# Patient Record
Sex: Male | Born: 1975 | Race: White | Hispanic: No | Marital: Married | Smoking: Never smoker
Health system: Southern US, Community
[De-identification: ages and names within clinical notes are randomized; demographics above are authoritative.]

## PROBLEM LIST (undated history)

## (undated) DIAGNOSIS — R600 Localized edema: Secondary | ICD-10-CM

## (undated) DIAGNOSIS — M1990 Unspecified osteoarthritis, unspecified site: Secondary | ICD-10-CM

## (undated) DIAGNOSIS — I10 Essential (primary) hypertension: Secondary | ICD-10-CM

## (undated) DIAGNOSIS — B019 Varicella without complication: Secondary | ICD-10-CM

## (undated) DIAGNOSIS — L409 Psoriasis, unspecified: Secondary | ICD-10-CM

---

## 2004-11-27 ENCOUNTER — Encounter: Admission: RE | Admit: 2004-11-27 | Discharge: 2004-11-27 | Payer: Self-pay | Admitting: Family Medicine

## 2006-11-13 ENCOUNTER — Encounter: Admission: RE | Admit: 2006-11-13 | Discharge: 2006-11-13 | Payer: Self-pay | Admitting: Orthopedic Surgery

## 2006-11-15 ENCOUNTER — Encounter: Admission: RE | Admit: 2006-11-15 | Discharge: 2006-11-15 | Payer: Self-pay | Admitting: Orthopedic Surgery

## 2007-02-07 ENCOUNTER — Emergency Department (HOSPITAL_COMMUNITY): Admission: EM | Admit: 2007-02-07 | Discharge: 2007-02-07 | Payer: Self-pay | Admitting: Emergency Medicine

## 2010-05-01 ENCOUNTER — Emergency Department (HOSPITAL_COMMUNITY): Payer: Self-pay

## 2010-05-01 ENCOUNTER — Emergency Department (HOSPITAL_COMMUNITY)
Admission: EM | Admit: 2010-05-01 | Discharge: 2010-05-01 | Disposition: A | Discharge status: Other Acute Inpatient Hospital | Payer: Self-pay | Attending: Emergency Medicine | Admitting: Emergency Medicine

## 2010-05-01 ENCOUNTER — Inpatient Hospital Stay (HOSPITAL_COMMUNITY)
Admission: EM | Admit: 2010-05-01 | Discharge: 2010-05-05 | DRG: 728 | Disposition: A | Discharge status: Home / Self Care | Payer: Self-pay | Attending: Internal Medicine | Admitting: Internal Medicine

## 2010-05-01 DIAGNOSIS — L02419 Cutaneous abscess of limb, unspecified: Secondary | ICD-10-CM | POA: Insufficient documentation

## 2010-05-01 DIAGNOSIS — A498 Other bacterial infections of unspecified site: Secondary | ICD-10-CM | POA: Diagnosis present

## 2010-05-01 DIAGNOSIS — N411 Chronic prostatitis: Secondary | ICD-10-CM | POA: Diagnosis present

## 2010-05-01 DIAGNOSIS — R509 Fever, unspecified: Secondary | ICD-10-CM | POA: Insufficient documentation

## 2010-05-01 DIAGNOSIS — M479 Spondylosis, unspecified: Secondary | ICD-10-CM | POA: Diagnosis present

## 2010-05-01 DIAGNOSIS — N410 Acute prostatitis: Principal | ICD-10-CM | POA: Diagnosis present

## 2010-05-01 DIAGNOSIS — L03119 Cellulitis of unspecified part of limb: Secondary | ICD-10-CM | POA: Diagnosis present

## 2010-05-01 DIAGNOSIS — T368X5A Adverse effect of other systemic antibiotics, initial encounter: Secondary | ICD-10-CM | POA: Diagnosis not present

## 2010-05-01 DIAGNOSIS — L270 Generalized skin eruption due to drugs and medicaments taken internally: Secondary | ICD-10-CM | POA: Diagnosis not present

## 2010-05-01 LAB — URINE MICROSCOPIC-ADD ON

## 2010-05-01 LAB — URINALYSIS, ROUTINE W REFLEX MICROSCOPIC
Hgb urine dipstick: NEGATIVE
Nitrite: NEGATIVE
Specific Gravity, Urine: >1.03 — ABNORMAL HIGH (ref 1.005–1.030)
Urine Glucose, Fasting: NEGATIVE mg/dL
Urine Glucose, Fasting: NEGATIVE mg/dL
Urobilinogen, UA: 1 mg/dL (ref 0.0–1.0)
pH: 6 (ref 5.0–8.0)

## 2010-05-01 LAB — BASIC METABOLIC PANEL
BUN: 10 mg/dL (ref 6–23)
CO2: 26 mEq/L (ref 19–32)
Calcium: 8.7 mg/dL (ref 8.4–10.5)
GFR calc Af Amer: >60 mL/min (ref >60)
GFR calc non Af Amer: >60 mL/min (ref >60)

## 2010-05-01 LAB — CBC
MCH: 28.1 pg (ref 26.0–34.0)
MCV: 82.3 fL (ref 78.0–100.0)
Platelets: 152 10*3/uL (ref 150–400)
RDW: 12.9 % (ref 11.5–15.5)
WBC: 11.1 10*3/uL — ABNORMAL HIGH (ref 4.0–10.5)

## 2010-05-01 LAB — DIFFERENTIAL
Basophils Absolute: 0 10*3/uL (ref 0.0–0.1)
Eosinophils Absolute: 0 10*3/uL (ref 0.0–0.7)
Eosinophils Relative: 0 % (ref 0–5)
Monocytes Absolute: 0.8 10*3/uL (ref 0.1–1.0)

## 2010-05-02 ENCOUNTER — Inpatient Hospital Stay (HOSPITAL_COMMUNITY): Payer: Self-pay

## 2010-05-02 DIAGNOSIS — L0390 Cellulitis, unspecified: Secondary | ICD-10-CM

## 2010-05-02 DIAGNOSIS — N419 Inflammatory disease of prostate, unspecified: Secondary | ICD-10-CM

## 2010-05-02 DIAGNOSIS — L0291 Cutaneous abscess, unspecified: Secondary | ICD-10-CM

## 2010-05-02 LAB — COMPREHENSIVE METABOLIC PANEL
ALT: 31 U/L (ref 0–53)
AST: 16 U/L (ref 0–37)
Albumin: 3.2 g/dL — ABNORMAL LOW (ref 3.5–5.2)
Alkaline Phosphatase: 53 U/L (ref 39–117)
BUN: 9 mg/dL (ref 6–23)
CO2: 27 mEq/L (ref 19–32)
Calcium: 8.8 mg/dL (ref 8.4–10.5)
Chloride: 106 mEq/L (ref 96–112)
Creatinine, Ser: 0.96 mg/dL (ref 0.4–1.5)
GFR calc Af Amer: >60 mL/min (ref >60)
GFR calc non Af Amer: >60 mL/min (ref >60)
Glucose, Bld: 113 mg/dL — ABNORMAL HIGH (ref 70–99)
Potassium: 4.1 mEq/L (ref 3.5–5.1)
Sodium: 138 mEq/L (ref 135–145)
Total Bilirubin: 1 mg/dL (ref 0.3–1.2)
Total Protein: 6.2 g/dL (ref 6.0–8.3)

## 2010-05-02 LAB — TSH: TSH: 1.152 u[IU]/mL (ref 0.350–4.500)

## 2010-05-02 LAB — CBC
MCH: 27.8 pg (ref 26.0–34.0)
Platelets: 153 10*3/uL (ref 150–400)

## 2010-05-02 LAB — APTT: aPTT: 37 seconds (ref 24–37)

## 2010-05-02 LAB — DIFFERENTIAL
Basophils Absolute: 0 10*3/uL (ref 0.0–0.1)
Lymphs Abs: 1.2 10*3/uL (ref 0.7–4.0)
Monocytes Absolute: 0.8 10*3/uL (ref 0.1–1.0)
Monocytes Relative: 10 % (ref 3–12)
Neutro Abs: 5.8 10*3/uL (ref 1.7–7.7)
Neutrophils Relative %: 74 % (ref 43–77)

## 2010-05-02 LAB — HEMOGLOBIN A1C
Hgb A1c MFr Bld: 5.3 % (ref <5.7)
Mean Plasma Glucose: 105 mg/dL (ref <117)

## 2010-05-02 LAB — PROTIME-INR
INR: 1.05 (ref 0.00–1.49)
Prothrombin Time: 13.9 seconds (ref 11.6–15.2)

## 2010-05-02 NOTE — Consult Note (Signed)
[name], [name]                  ACCOUNT NO.:  [id number]  MEDICAL RECORD NO.:  [id number]           PATIENT TYPE:  I  LOCATION:  1338                         FACILITY:  [facility]  PHYSICIAN:  Armany Mano C. Vernie Ammons, M.D.  DATE OF BIRTH:  1975/03/11  DATE OF CONSULTATION:  05/02/2010 DATE OF DISCHARGE:                                CONSULTATION   HISTORY:  The patient is a 35 year old white male seen in hospital consultation today for further evaluation of recurrent prostatitis and associated right lower extremity cellulitis.  The patient gives a history of having had recurrent prostatitis over the years on multiple occasions.  Each time he experiences initially low back pain which then is associated with pain down into the genital region and testicles.  At no time has he noted any swelling of the testis.  He does have associated dysuria with this.  In addition, he indicates that each time he has an episode of prostatitis, which in the past has successfully been treated with antibiotics, he will have erythema and tenderness with a cellulitis like picture, involving the right lower extremity.  His last episode of prostatitis was approximately 1 year ago.  The first episode was approximately 5 years ago.  He also has a history of recurrence of sinusitis.  He denies any hematuria and is not having any true flank pain.  His symptoms would be considered of moderate severity with no modifying factors.  PAST MEDICAL HISTORY:  Positive for, 1. Right lower extremity cellulitis. 2. History of chronic prostatitis. 3. History of chronic recurrent sinusitis.  SURGICAL HISTORY:  None.  MEDICATIONS ON ADMISSION:  None.  ALLERGIES:  PENICILLIN causes hives.  SOCIAL HISTORY:  He lives at home and reports past ethanol use.  He denies tobacco use.  FAMILY HISTORY:  Positive for coronary artery disease and diabetes in his father.  REVIEW OF SYSTEMS:  Positive for as noted above in the history  of present illness.  He is also noted some upper respiratory symptoms like a cold and occasional dry cough, but no chest pain or shortness of breath.  Otherwise, his 13-point review of systems is negative.  PHYSICAL EXAMINATION:  VITAL SIGNS:  Temperature 99.1, blood pressure is 122/76, pulse 99. GENERAL:  The patient is alert and oriented with appropriate mood and affect.  He appears to be in no apparent distress. HEENT:  Atraumatic, normocephalic.  Oropharynx is clear. NECK:  Supple with midline trachea. CARDIOVASCULAR:  Regular rate and rhythm with no murmur. ABDOMEN:  Obese, soft, and nontender without mass or HSM.  No CVAT. EXTREMITIES:  Erythema below the knee and above the ankle of his right foot anteriorly.  There is no crepitus and it is nontender to palpation. Otherwise extremities are without clubbing, cyanosis, or edema. SKIN:  Warm and dry with multiple tattoos. NEUROLOGIC:  He has no gross focal neurologic deficits.  LABORATORY RESULTS:  His white blood cell count on admission was 11.1 with a left shift, most recently his white blood cell count is normal 7.8 with a hemoglobin of 13.2.  His blood cultures are negative x2.  His  serum creatinine is 0.96 and his coagulation factors are normal.  His urinalysis had 0-2 white blood cells and a few bacteria, but is otherwise negative.  Scrotal ultrasound images were independently reviewed.  I note both testicles are normal consistency without any abnormal internal echo pattern.  Epididymis also appeared normal bilaterally.  This appears to be normal bilateral scrotal ultrasound.  IMPRESSION:  His symptoms suggest an acute flare of chronic prostatitis, but he has no elevation of his white count, clear urine, and a nontender prostate without any sign of prostatic abscess.  I cannot connect the right lower extremity cellulitis with his prostatitis which seems to occur each time in tandem.  Otherwise, his prostatitis appears to  be uncomplicated and should respond to antibiotic therapy.  RECOMMENDATIONS: 1. Broad-spectrum antibiotics intravenously, initially was switched to     oral to be taken for at least 4 weeks with 1 refill and taken for     second 4 weeks if his symptoms would not completely resolve after     the first 4 weeks of antibiotic therapy. 2. Consider ID consult due to recurrent prostatitis with associated     cellulitis of the leg.     Kadarius Cuffe C. Vernie Ammons, M.D.     MCO/MEDQ  D:  05/02/2010  T:  05/02/2010  Job:  [id number]  Electronically Signed by Ihor Gully M.D. on 05/02/2010 06:01:08 PM

## 2010-05-03 ENCOUNTER — Inpatient Hospital Stay (HOSPITAL_COMMUNITY): Payer: Self-pay

## 2010-05-03 DIAGNOSIS — M7989 Other specified soft tissue disorders: Secondary | ICD-10-CM

## 2010-05-03 MED ORDER — IOHEXOL 300 MG/ML  SOLN
130.0000 mL | Freq: Once | INTRAMUSCULAR | Status: AC | PRN
  Administered 2010-05-03: 130 mL via INTRAVENOUS

## 2010-05-03 NOTE — H&P (Signed)
NAMEAGUSTIN, SWATEK                  ACCOUNT NO.:  [id number]  MEDICAL RECORD NO.:  [id number]           PATIENT TYPE:  E  LOCATION:  WLED                         FACILITY:  [facility]  PHYSICIAN:  Kathlen Mody, MD       DATE OF BIRTH:  [birth year]  DATE OF ADMISSION:  05/01/2010 DATE OF DISCHARGE:                             HISTORY & PHYSICAL   PRIMARY CARE PHYSICIAN:  Dr. Silvestre Gunner of family practice  CHIEF COMPLAINT:  Fever since 1 day.  HISTORY OF PRESENT ILLNESS:  This is a 35 year old male with past medical history of cellulitis and multiple episodes of prostatitis, sinusitis, came into ER complaining of fever of 102.7 associated with burning micturition, urinary frequency, urgency, perineal pain, and testicular pain similar to the episode of prostatitis in the past.  The patient is also complaining of right lower extremity erythema, tenderness with cellulitic-like picture since 1 day.  The patient is also complaining of some upper respiratory symptoms like cold, occasional dry cough.  No chest pain, shortness of breath.  No syncope. No dizziness or vertigo.  Denies any nausea, vomiting, diarrhea, or abdominal pain.  Denies any blurring of vision, headache, etc.  The last episode of prostatitis was about a year ago, but he did have multiple episodes of prostatitis every 3 to 6 months over the last 5 years where he got treated with Bactrim for about 4 weeks every time he got an episode of prostatitis.  REVIEW OF SYSTEMS:  See HPI, otherwise negative.  HOME MEDICATIONS:  None.  PAST MEDICAL HISTORY:  Right lower extremity cellulitis, prostatitis, sinusitis.  PAST SURGICAL HISTORY:  None.  FAMILY HISTORY:  Coronary artery disease and diabetes in the father.  ALLERGIES:  The patient is allergic to penicillin, gives him hives.  SOCIAL HISTORY:  He lives at home.  He is an ex ETOH use.  Denies smoking or IV drug abuse.  PHYSICAL EXAMINATION:  VITAL SIGNS:  The patient's  latest vitals are temperature of 99, pulse rate of 99 per minute, respiratory rate of 20 per minute, blood pressure 137/85, saturating about 95% on room air. GENERAL:  He was alert, afebrile, mild distress from perineal pain. HEENT:  Pupils equal and reacting to light.  Moist mucous membranes. NECK:  No JVD. CARDIOVASCULAR:  S1 and S2 heard.  No rubs, murmurs, or gallops. Regular rate rhythm. RESPIRATORY:  Good air entry bilateral.  No adventitious sounds. ABDOMINAL:  Abdomen is obese, soft, nontender, nondistended, and good bowel sounds. EXTREMITIES:  Right lower extremity over the lower leg has erythema extending from the knee and to the ankle.  No draining or weeping and tender to touch with left lower extremity within normal limits. NEUROLOGICAL:  Nonfocal at this time.  LABORATORY DATA:  We have a urinalysis, which showed trace leukocytes with protein in it.  CBC showed WBC count of 11.1, hemoglobin of 14, hematocrit of 41, platelets of 152 with left shift neutrophils.  Basic metabolic panel was within normal limits except for glucose of 108.  The patient had a rapid strep test, which was negative.  RADIOLOGY:  The patient  had a chest x-ray, which showed no acute cardiopulmonary disease.  ASSESSMENT: 1. Acute on chronic prostatitis. 2. Right lower extremity cellulitis. 3. Hyperglycemia.  PLAN:  Acute and chronic prostatitis. We would start the patient on IV Levaquin 500 mg daily.  We will also start him on IV fluids.  Get urine Gram stain and urine culture.  The patient is also complaining of testicular pain.  I would rule out acute epididymis.  We will get an ultrasound of the scrotum.  We will also get a urology consult and pain medication as needed.  Multiple episodes of right lower extremity cellulitis.  The patient denies any injury.  There is no skin break.  Patient has a history of hyperglycemia.  We will first start the patient on IV antibiotics, IV vancomycin,  and dosing as per the pharmacy.  We will get a hemoglobin A1c to see if he has any diabetes, especially concerning with the body habitus and the history of diabetes in the father.  Hyperglycemia.  Get hemoglobin A1c to see if he has diabetes.  Subcu Lovenox for DVT prophylaxis.  The patient is full code.  Time spent with the patient with the history and physical and care of the patient about 40 minutes.          ______________________________ Kathlen Mody, MD     VA/MEDQ  D:  05/01/2010  T:  05/01/2010  Job:  [id number]  Electronically Signed by Kathlen Mody MD on 05/03/2010 12:26:55 AM

## 2010-05-04 ENCOUNTER — Inpatient Hospital Stay (HOSPITAL_COMMUNITY): Payer: Self-pay

## 2010-05-04 DIAGNOSIS — L0291 Cutaneous abscess, unspecified: Secondary | ICD-10-CM

## 2010-05-04 DIAGNOSIS — N419 Inflammatory disease of prostate, unspecified: Secondary | ICD-10-CM

## 2010-05-04 DIAGNOSIS — L0390 Cellulitis, unspecified: Secondary | ICD-10-CM

## 2010-05-04 LAB — URINE CULTURE: Colony Count: 2000

## 2010-05-06 LAB — URINE CULTURE
Colony Count: 70000
Culture  Setup Time: 201202230130

## 2010-05-06 LAB — GC/CHLAMYDIA PROBE AMP, URINE
Chlamydia, Swab/Urine, PCR: NEGATIVE
GC Probe Amp, Urine: NEGATIVE

## 2010-05-06 LAB — MISCELLANEOUS TEST

## 2010-05-06 LAB — ANA: Anti Nuclear Antibody(ANA): NEGATIVE

## 2010-05-07 LAB — CULTURE, BLOOD (ROUTINE X 2): Culture  Setup Time: 201202222322

## 2010-05-08 LAB — CHLAMYDIA ANTIBODIES, IGG: Chlamydia trachomatis IgG: <1:64 {titer}

## 2010-05-08 LAB — HLA-B27 ANTIGEN

## 2010-05-17 NOTE — Discharge Summary (Signed)
[name], [name]                  ACCOUNT NO.:  [id number]  MEDICAL RECORD NO.:  [id number]           PATIENT TYPE:  I  LOCATION:  1338                         FACILITY:  [facility]  PHYSICIAN:  Ladell Pier, M.D.   DATE OF BIRTH:  1976/02/26  DATE OF ADMISSION:  05/01/2010 DATE OF DISCHARGE:  05/04/2010                              DISCHARGE SUMMARY   DISCHARGE DIAGNOSES: 1. Recurrent prostatitis. 2. Recurrent cellulitis. 3. History of chronic recurrent sinusitis.  DISCHARGE MEDICATIONS: 1. Bactrim Double Strength 1 twice daily for 2 months. 2. Oxycodone 5/325 every 4 hours p.r.n.  FOLLOWUP APPOINTMENT:  The patient given number to call the scheduled appointment to follow up with urology, Dr. Vernie Ammons, [phone] and to follow up with his PCP, Dr. Arlyce Dice in 1 week.  PROCEDURES:  CT scan of the pelvis with contrast showed cellulitis of the subcutaneous fat of the right lower leg, otherwise normal.  Scrotal ultrasound, no increased flow to the epididymis to suggest presence of epididymitis.  Chest x-ray, no acute cardiopulmonary process.  CONSULTANT: 1. Infectious disease, Fransisco Hertz, M.D. 2. Urology, Veverly Fells. Vernie Ammons, M.D.  HISTORY OF PRESENT ILLNESS:  The patient is a 35 year old male with past medical history significant for cellulitis, multiple episodes of prostatitis, and sinusitis who came to the ER complaining of fevers up to 102.7 associated with burning on urination, urinary frequency, urgency, and perineal pain and testicular pain similar to episodes of prostatitis in the past.  The patient is also complaining of right lower extremity edema and tenderness.  The patient had no dizziness, no nausea or vomiting.  He had an episode about a year ago of prostatitis but he has had multiple episodes of prostatitis every 3-6 months over the past 5 years.  He was treated with Bactrim for about 4 weeks every time he got an episode of prostatitis.  Please see admission note  for remainder of history, past medical history, family history, social history, meds, allergies, and review of systems per admission H and P.  PHYSICAL EXAMINATION AT THE TIME OF DISCHARGE:  VITAL SIGNS: Temperature 97.9, pulse of 64, respirations 20, blood pressure 133/86, and pulse ox 96% on room air. GENERAL:  The patient is laying in bed, well-nourished, obese white male. HEENT:  Normocephalic, atraumatic.  Pupils reactive to light without erythema. CARDIOVASCULAR:  Regular rate and rhythm. LUNGS:  Clear bilaterally. ABDOMEN:  Positive bowel sounds. EXTREMITIES:  He has some mild erythema of the right lower extremity that is much improved.  HOSPITAL COURSE:  Prostatitis/cellulitis.  The patient was admitted to the hospital, placed on IV Levaquin and vancomycin.  He got the red man syndrome from the vancomycin and that was discontinued and he was continued on the IV Levaquin which was changed to p.o.  On discussion with Dr. Maurice March, one of his cultures came back with 2000 of E-coli and the other one was 70,000 E-coli.  If indeed it is truly 70,000 of E-coli, then indeed he may have prostatitis, and per discussion with Dr. Maurice March, he should be treated for about 2-3 months with Septra.  Noted in the past,  he has been treated with Septra for 1 month and still had recurrent prostatitis, so we will treat him with Septra for 2-3 months. His cellulitis looks much improved, and the Septra does cover the cellulitis as well.  LABORATORY DATA:  At the time of discharge, urine culture showed 70,000 E-coli.  TSH 1.152.  Blood cultures negative x2.  Magnesium level 2.2. Sodium 138, potassium 4.1, chloride 106, CO2 of 27, glucose 113, BUN 9, and creatinine 0.96.  AST 16, ALT 31.  TIME SPENT:  With the patient and wife and doing this discharge is approximately 35 minutes.     Ladell Pier, M.D.     N[place]/MEDQ  D:  05/04/2010  T:  05/04/2010  Job:  [id number]  cc:   Loraine Leriche C. Vernie Ammons,  M.D. Fax: [phone]  Dr. Karen Chafe  Electronically Signed by Ladell Pier M.D. on 05/16/2010 07:56:23 AM

## 2010-05-17 NOTE — Discharge Summary (Signed)
  [name], [name]                  ACCOUNT NO.:  [id number]  MEDICAL RECORD NO.:  [id number]           PATIENT TYPE:  I  LOCATION:  1338                         FACILITY:  [facility]  PHYSICIAN:  Ladell Pier, M.D.   DATE OF BIRTH:  [birth year]  DATE OF ADMISSION:  05/01/2010 DATE OF DISCHARGE:                              DISCHARGE SUMMARY   The patient was to be discharged on May 04, 2010.  Discharge was held after consultation with Dr. Daiva Eves.  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  MRIs of the lumbar sacral spine were ordered but the patient unable to sit in the scanner here, so the patient will have to follow up outpatient to get the procedure done, but he did order other tests, some of the results are still pending but the HIV test is negative.  Rheumatoid factor is less than 10.  C-reactive protein is 2.3.  ESR is 17.  After discussion with Dr. Daiva Eves, we will discharge him on Cipro twice a day for 1 month.  Then, he needs to get a refill from his primary care to do another month.  His wife is concerned about the possibility of Crohn disease as he has been having some issues with his GI system in association with the arthropathy, so discussed with her that I will discharge him, I will give him a dose of steroid now.  If he responds, then we will discharge him on prednisone taper, and he will follow up with Dr. Lovell Sheehan.  We will try to get him in this coming up week and then we will try to get him in with rheumatology.  We told him to call Dr. Maurice March to schedule outpatient appointment and also to call Dr. Vernie Ammons to schedule an outpatient appointment.  We will try to see if I could get him an appointment with the rheumatologist.  Did discuss this with the patient and his wife and we will try to call them with these appointments.  The number was given to me by his wife for him, it is [phone] and for his  wife, Brallan Denio, it is [phone].  Time spent with the patient on doing this discharge is approximately 45 minutes, this time was spent discussing with the patient plan of care after discharge as the patient wanted to stay inhouse to get all this workup done but since this is not something that is very acute, this is really not an inpatient workup and did discuss this with his wife and with the patient.     Ladell Pier, M.D.     N[place]/MEDQ  D:  05/05/2010  T:  05/05/2010  Job:  [id number]  Electronically Signed by Ladell Pier M.D. on 05/16/2010 07:56:26 AM

## 2010-12-17 LAB — CBC
HCT: 44.1
Hemoglobin: 15.2
Platelets: 200
RDW: 13.2
WBC: 15.2 — ABNORMAL HIGH

## 2010-12-17 LAB — URINALYSIS, ROUTINE W REFLEX MICROSCOPIC
Glucose, UA: NEGATIVE
Nitrite: NEGATIVE
Protein, ur: NEGATIVE
pH: 7

## 2010-12-17 LAB — DIFFERENTIAL
Basophils Absolute: 0
Eosinophils Relative: 0
Lymphocytes Relative: 6 — ABNORMAL LOW
Lymphs Abs: 0.9
Neutro Abs: 13.4 — ABNORMAL HIGH

## 2010-12-17 LAB — I-STAT 8, (EC8 V) (CONVERTED LAB)
Acid-base deficit: 1
BUN: 12
Chloride: 105
HCT: 48
Hemoglobin: 16.3
Operator id: 151321
Potassium: 3.9
pCO2, Ven: 37.3 — ABNORMAL LOW

## 2010-12-17 LAB — POCT I-STAT CREATININE
Creatinine, Ser: 1
Operator id: 151321

## 2010-12-27 ENCOUNTER — Emergency Department (HOSPITAL_COMMUNITY)
Admission: EM | Admit: 2010-12-27 | Discharge: 2010-12-28 | Disposition: A | Discharge status: Home / Self Care | Payer: Self-pay | Attending: Emergency Medicine | Admitting: Emergency Medicine

## 2010-12-27 ENCOUNTER — Emergency Department (HOSPITAL_COMMUNITY): Payer: Self-pay

## 2010-12-27 DIAGNOSIS — M25519 Pain in unspecified shoulder: Secondary | ICD-10-CM | POA: Insufficient documentation

## 2010-12-27 DIAGNOSIS — I10 Essential (primary) hypertension: Secondary | ICD-10-CM | POA: Insufficient documentation

## 2010-12-27 DIAGNOSIS — R609 Edema, unspecified: Secondary | ICD-10-CM | POA: Insufficient documentation

## 2010-12-28 LAB — COMPREHENSIVE METABOLIC PANEL
ALT: 43 U/L (ref 0–53)
Alkaline Phosphatase: 75 U/L (ref 39–117)
BUN: 13 mg/dL (ref 6–23)
CO2: 27 mEq/L (ref 19–32)
Calcium: 9.3 mg/dL (ref 8.4–10.5)
GFR calc Af Amer: >90 mL/min (ref >90)
GFR calc non Af Amer: >90 mL/min (ref >90)
Glucose, Bld: 100 mg/dL — ABNORMAL HIGH (ref 70–99)
Potassium: 3.6 mEq/L (ref 3.5–5.1)
Sodium: 139 mEq/L (ref 135–145)

## 2010-12-28 LAB — URINALYSIS, ROUTINE W REFLEX MICROSCOPIC
Bilirubin Urine: NEGATIVE
Ketones, ur: NEGATIVE mg/dL
Leukocytes, UA: NEGATIVE
Nitrite: NEGATIVE
Urobilinogen, UA: 0.2 mg/dL (ref 0.0–1.0)

## 2010-12-28 LAB — DIFFERENTIAL
Basophils Absolute: 0.1 10*3/uL (ref 0.0–0.1)
Eosinophils Relative: 1 % (ref 0–5)
Lymphocytes Relative: 36 % (ref 12–46)
Lymphs Abs: 2.3 10*3/uL (ref 0.7–4.0)
Monocytes Relative: 10 % (ref 3–12)
Neutro Abs: 3.3 10*3/uL (ref 1.7–7.7)

## 2010-12-28 LAB — CBC
HCT: 41.3 % (ref 39.0–52.0)
Hemoglobin: 13.9 g/dL (ref 13.0–17.0)
MCH: 28.1 pg (ref 26.0–34.0)
MCHC: 33.7 g/dL (ref 30.0–36.0)
RBC: 4.95 MIL/uL (ref 4.22–5.81)

## 2011-08-27 ENCOUNTER — Emergency Department (HOSPITAL_COMMUNITY)
Admission: EM | Admit: 2011-08-27 | Discharge: 2011-08-27 | Disposition: A | Discharge status: Home / Self Care | Payer: Self-pay | Attending: Emergency Medicine | Admitting: Emergency Medicine

## 2011-08-27 ENCOUNTER — Encounter (HOSPITAL_COMMUNITY): Payer: Self-pay | Admitting: Emergency Medicine

## 2011-08-27 DIAGNOSIS — R609 Edema, unspecified: Secondary | ICD-10-CM | POA: Insufficient documentation

## 2011-08-27 DIAGNOSIS — Z79899 Other long term (current) drug therapy: Secondary | ICD-10-CM | POA: Insufficient documentation

## 2011-08-27 DIAGNOSIS — X500XXA Overexertion from strenuous movement or load, initial encounter: Secondary | ICD-10-CM | POA: Insufficient documentation

## 2011-08-27 DIAGNOSIS — S335XXA Sprain of ligaments of lumbar spine, initial encounter: Secondary | ICD-10-CM | POA: Insufficient documentation

## 2011-08-27 DIAGNOSIS — M545 Low back pain: Secondary | ICD-10-CM

## 2011-08-27 DIAGNOSIS — Z885 Allergy status to narcotic agent status: Secondary | ICD-10-CM | POA: Insufficient documentation

## 2011-08-27 DIAGNOSIS — Z880 Allergy status to penicillin: Secondary | ICD-10-CM | POA: Insufficient documentation

## 2011-08-27 HISTORY — DX: Localized edema: R60.0

## 2011-08-27 MED ORDER — CYCLOBENZAPRINE HCL 10 MG PO TABS
10.0000 mg | ORAL_TABLET | Freq: Once | ORAL | Status: AC
  Administered 2011-08-27: 10 mg via ORAL
  Filled 2011-08-27: qty 1

## 2011-08-27 MED ORDER — CYCLOBENZAPRINE HCL 10 MG PO TABS: 10.0000 mg | ORAL_TABLET | Freq: Three times a day (TID) | ORAL | Status: DC | PRN

## 2011-08-27 MED ORDER — IBUPROFEN 800 MG PO TABS
800.0000 mg | ORAL_TABLET | Freq: Once | ORAL | Status: AC
  Administered 2011-08-27: 800 mg via ORAL
  Filled 2011-08-27: qty 1

## 2011-08-27 MED ORDER — OXYCODONE-ACETAMINOPHEN 5-325 MG PO TABS
1.0000 | ORAL_TABLET | Freq: Once | ORAL | Status: AC
  Administered 2011-08-27: 1 via ORAL
  Filled 2011-08-27: qty 1

## 2011-08-27 MED ORDER — OXYCODONE-ACETAMINOPHEN 5-325 MG PO TABS: 1.0000 | ORAL_TABLET | ORAL | Status: DC | PRN

## 2011-08-27 NOTE — ED Notes (Signed)
MD at bedside to evaluate.

## 2011-08-27 NOTE — ED Notes (Signed)
Medicated for 7\10 pain. Denies any needs at this time. Call bell within reach.

## 2011-08-27 NOTE — ED Notes (Signed)
Remains resting in bed on back. Pain 7\10 at this time. No distress. Denies needs. Call bell within reach. Awaiting eval.

## 2011-08-27 NOTE — ED Notes (Signed)
Remains resting sitting up in bed. No distress. Denies needs at this time. Pain 7\10. Awaiting to be seen. Call bell within reach.

## 2011-08-27 NOTE — ED Notes (Signed)
Into room to see patient. Resting sitting up in bed. States pain is 7\10. Turned wrong on Sunday and felt something pop in lower left back. Has sharp, nagging pain on entire left side of back. Tender on palpation. Nothing makes pain better or worse. Call bell within reach. Given water per request. Will continue to monitor.

## 2011-08-27 NOTE — ED Provider Notes (Signed)
History     CSN: [id number]  Arrival date & time 08/27/11  4098   First MD Initiated Contact with Patient 08/27/11 [phone]      Chief Complaint  Patient presents with  . Back Pain    (Consider location/radiation/quality/duration/timing/severity/associated sxs/prior treatment) HPI 36 year old male had onset of left lower lumbar pain 3 days ago when he twisted and felt something pop in his back. Since then, he has had severe pain pain is well only in the left lumbar area and does not radiate. He described as dull pain which is currently rated at 7/10 but has been as severe as 9/10. He denies any weakness, numbness, tingling. Denies any bowel or bladder dysfunction. He has taken ibuprofen 400 mg every 4-6 hours with no relief. He has not used ice or heat. He denies previous episodes like this. Past Medical History  Diagnosis Date  . Fluid retention in legs     History reviewed. No pertinent past surgical history.  History reviewed. No pertinent family history.  History  Substance Use Topics  . Smoking status: Never Smoker   . Smokeless tobacco: Not on file  . Alcohol Use: Yes     ocassionally      Review of Systems  Allergies  Penicillins and Dilaudid  Home Medications   Current Outpatient Rx  Name Route Sig Dispense Refill  . HYDROCHLOROTHIAZIDE 25 MG PO TABS Oral Take 25 mg by mouth daily.    . CYCLOBENZAPRINE HCL 10 MG PO TABS Oral Take 1 tablet (10 mg total) by mouth 3 (three) times daily as needed for muscle spasms. 30 tablet 0  . OXYCODONE-ACETAMINOPHEN 5-325 MG PO TABS Oral Take 1 tablet by mouth every 4 (four) hours as needed for pain. 20 tablet 0    BP 170/85  Pulse 78  Temp 98.4 F (36.9 C) (Oral)  Resp 28  Ht 6' (1.829 m)  Wt 350 lb (158.759 kg)  BMI 47.47 kg/m2  SpO2 94%  Physical Exam 36 year old male who is resting comfortably and in no acute distress. He is morbidly obese. Vital signs are normal. Oxygen saturation is 98% which is normal. Head is  normocephalic and atraumatic. PERRLA, EOMI. Neck is nontender and supple. Back has moderate tenderness in the lumbar area with moderate to severe left paralumbar spasm. Straight leg raise is positive on the left at 45 and crossed straight leg raise is positive on the right 45.  Lungs are clear without rales, wheezes, or rhonchi. Heart has regular rate and rhythm without murmur. Abdomen is soft, flat, nontender without masses or hepatosplenomegaly. Extremities: There is 2+ pretibial edema, full range of motion is present, there is no cyanosis. Skin is warm and dry without rash. Neurologic: Mental status is normal, cranial nerves are intact, there are no motor or sensory deficits. ED Course  Procedures (including critical care time)  Labs Reviewed - No data to display No results found.   1. Lumbar pain       MDM  Acute lumbar strain. You'll be treated with NSAIDs, muscle relaxers, and narcotics.        Dione Booze, MD 08/27/11 [phone]

## 2011-08-27 NOTE — ED Notes (Signed)
Patient states he twisted his back 3 days ago and pain is worsening.

## 2011-08-27 NOTE — Discharge Instructions (Signed)
Take an anti-inflammatory dose of anti-inflammatory medication. This is either 12 tablets of ibuprofen a day or 4 tablets of Aleve a day. Make an appointment with your doctor for followup. Try to lose weight. If pain is not being controlled adequately with this regimen, you may need to go through some physical therapy.  Back Pain, Adult Low back pain is very common. About 1 in 5 people have back pain.The cause of low back pain is rarely dangerous. The pain often gets better over time.About half of people with a sudden onset of back pain feel better in just 2 weeks. About 8 in 10 people feel better by 6 weeks.  CAUSES Some common causes of back pain include:  Strain of the muscles or ligaments supporting the spine.   Wear and tear (degeneration) of the spinal discs.   Arthritis.   Direct injury to the back.  DIAGNOSIS Most of the time, the direct cause of low back pain is not known.However, back pain can be treated effectively even when the exact cause of the pain is unknown.Answering your caregiver's questions about your overall health and symptoms is one of the most accurate ways to make sure the cause of your pain is not dangerous. If your caregiver needs more information, he or she may order lab work or imaging tests (X-rays or MRIs).However, even if imaging tests show changes in your back, this usually does not require surgery. HOME CARE INSTRUCTIONS For many people, back pain returns.Since low back pain is rarely dangerous, it is often a condition that people can learn to [facility] their own.   Remain active. It is stressful on the back to sit or stand in one place. Do not sit, drive, or stand in one place for more than 30 minutes at a time. Take short walks on level surfaces as soon as pain allows.Try to increase the length of time you walk each day.   Do not stay in bed.Resting more than 1 or 2 days can delay your recovery.   Do not avoid exercise or work.Your body is made to  move.It is not dangerous to be active, even though your back may hurt.Your back will likely heal faster if you return to being active before your pain is gone.   Pay attention to your body when you bend and lift. Many people have less discomfortwhen lifting if they bend their knees, keep the load close to their bodies,and avoid twisting. Often, the most comfortable positions are those that put less stress on your recovering back.   Find a comfortable position to sleep. Use a firm mattress and lie on your side with your knees slightly bent. If you lie on your back, put a pillow under your knees.   Only take over-the-counter or prescription medicines as directed by your caregiver. Over-the-counter medicines to reduce pain and inflammation are often the most helpful.Your caregiver may prescribe muscle relaxant drugs.These medicines help dull your pain so you can more quickly return to your normal activities and healthy exercise.   Put ice on the injured area.   Put ice in a plastic bag.   Place a towel between your skin and the bag.   Leave the ice on for 15 to 20 minutes, 3 to 4 times a day for the first 2 to 3 days. After that, ice and heat may be alternated to reduce pain and spasms.   Ask your caregiver about trying back exercises and gentle massage. This may be of some benefit.  Avoid feeling anxious or stressed.Stress increases muscle tension and can worsen back pain.It is important to recognize when you are anxious or stressed and learn ways to manage it.Exercise is a great option.  SEEK MEDICAL CARE IF:  You have pain that is not relieved with rest or medicine.   You have pain that does not improve in 1 week.   You have new symptoms.   You are generally not feeling well.  SEEK IMMEDIATE MEDICAL CARE IF:   You have pain that radiates from your back into your legs.   You develop new bowel or bladder control problems.   You have unusual weakness or numbness in your arms  or legs.   You develop nausea or vomiting.   You develop abdominal pain.   You feel faint.  Document Released: 02/24/2005 Document Revised: 02/13/2011 Document Reviewed: 07/15/2010 [facility] Patient Information 2012 [place], [place].  Acetaminophen; Oxycodone tablets What is this medicine? ACETAMINOPHEN; OXYCODONE (a set a MEE noe fen; ox i KOE done) is a pain reliever. It is used to treat mild to moderate pain. This medicine may be used for other purposes; ask your health care provider or pharmacist if you have questions. What should I tell my health care provider before I take this medicine? They need to know if you have any of these conditions: -brain tumor -Crohn's disease, inflammatory bowel disease, or ulcerative colitis -drink more than 3 alcohol containing drinks per day -drug abuse or addiction -head injury -heart or circulation problems -kidney disease or problems going to the bathroom -liver disease -lung disease, asthma, or breathing problems -an unusual or allergic reaction to acetaminophen, oxycodone, other opioid analgesics, other medicines, foods, dyes, or preservatives -pregnant or trying to get pregnant -breast-feeding How should I use this medicine? Take this medicine by mouth with a full glass of water. Follow the directions on the prescription label. Take your medicine at regular intervals. Do not take your medicine more often than directed. Talk to your pediatrician regarding the use of this medicine in children. Special care may be needed. Patients over 6 years old may have a stronger reaction and need a smaller dose. Overdosage: If you think you have taken too much of this medicine contact a poison control center or emergency room at once. NOTE: This medicine is only for you. Do not share this medicine with others. What if I miss a dose? If you miss a dose, take it as soon as you can. If it is almost time for your next dose, take only that dose. Do not take  double or extra doses. What may interact with this medicine? -alcohol or medicines that contain alcohol -antihistamines -barbiturates like amobarbital, butalbital, butabarbital, methohexital, pentobarbital, phenobarbital, thiopental, and secobarbital -benztropine -drugs for bladder problems like solifenacin, trospium, oxybutynin, tolterodine, hyoscyamine, and methscopolamine -drugs for breathing problems like ipratropium and tiotropium -drugs for certain stomach or intestine problems like propantheline, homatropine methylbromide, glycopyrrolate, atropine, belladonna, and dicyclomine -general anesthetics like etomidate, ketamine, nitrous oxide, propofol, desflurane, enflurane, halothane, isoflurane, and sevoflurane -medicines for depression, anxiety, or psychotic disturbances -medicines for pain like codeine, morphine, pentazocine, buprenorphine, butorphanol, nalbuphine, tramadol, and propoxyphene -medicines for sleep -muscle relaxants -naltrexone -phenothiazines like perphenazine, thioridazine, chlorpromazine, mesoridazine, fluphenazine, prochlorperazine, promazine, and trifluoperazine -scopolamine -trihexyphenidyl This list may not describe all possible interactions. Give your health care provider a list of all the medicines, herbs, non-prescription drugs, or dietary supplements you use. Also tell them if you smoke, drink alcohol, or use illegal drugs. Some items may  interact with your medicine. What should I watch for while using this medicine? Tell your doctor or health care professional if your pain does not go away, if it gets worse, or if you have new or a different type of pain. You may develop tolerance to the medicine. Tolerance means that you will need a higher dose of the medication for pain relief. Tolerance is normal and is expected if you take this medicine for a long time. Do not suddenly stop taking your medicine because you may develop a severe reaction. Your body becomes used  to the medicine. This does NOT mean you are addicted. Addiction is a behavior related to getting and using a drug for a nonmedical reason. If you have pain, you have a medical reason to take pain medicine. Your doctor will tell you how much medicine to take. If your doctor wants you to stop the medicine, the dose will be slowly lowered over time to avoid any side effects. You may get drowsy or dizzy. Do not drive, use machinery, or do anything that needs mental alertness until you know how this medicine affects you. Do not stand or sit up quickly, especially if you are an older patient. This reduces the risk of dizzy or fainting spells. Alcohol may interfere with the effect of this medicine. Avoid alcoholic drinks. The medicine will cause constipation. Try to have a bowel movement at least every 2 to 3 days. If you do not have a bowel movement for 3 days, call your doctor or health care professional. Do not take Tylenol (acetaminophen) or medicines that have acetaminophen with this medicine. Too much acetaminophen can be very dangerous. Many nonprescription medicines contain acetaminophen. Always read the labels carefully to avoid taking more acetaminophen. What side effects may I notice from receiving this medicine? Side effects that you should report to your doctor or health care professional as soon as possible: -allergic reactions like skin rash, itching or hives, swelling of the face, lips, or tongue -breathing difficulties, wheezing -confusion -light headedness or fainting spells -severe stomach pain -yellowing of the skin or the whites of the eyes Side effects that usually do not require medical attention (report to your doctor or health care professional if they continue or are bothersome): -dizziness -drowsiness -nausea -vomiting This list may not describe all possible side effects. Call your doctor for medical advice about side effects. You may report side effects to FDA at  1-800-FDA-1088. Where should I keep my medicine? Keep out of the reach of children. This medicine can be abused. Keep your medicine in a safe place to protect it from theft. Do not share this medicine with anyone. Selling or giving away this medicine is dangerous and against the law. Store at room temperature between 20 and 25 degrees C (68 and 77 degrees F). Keep container tightly closed. Protect from light. Flush any unused medicines down the toilet. Do not use the medicine after the expiration date. NOTE: This sheet is a summary. It may not cover all possible information. If you have questions about this medicine, talk to your doctor, pharmacist, or health care provider.  2012, [employer]. (01/24/2008 10:01:21 AM)  Cyclobenzaprine tablets What is this medicine? CYCLOBENZAPRINE (sye kloe BEN za preen) is a muscle relaxer. It is used to treat muscle pain, spasms, and stiffness. This medicine may be used for other purposes; ask your health care provider or pharmacist if you have questions. What should I tell my health care provider before I take this  medicine? They need to know if you have any of these conditions: -heart disease, irregular heartbeat, or previous heart attack -liver disease -thyroid problem -an unusual or allergic reaction to cyclobenzaprine, tricyclic antidepressants, lactose, other medicines, foods, dyes, or preservatives -pregnant or trying to get pregnant -breast-feeding How should I use this medicine? Take this medicine by mouth with a glass of water. Follow the directions on the prescription label. If this medicine upsets your stomach, take it with food or milk. Take your medicine at regular intervals. Do not take it more often than directed. Talk to your pediatrician regarding the use of this medicine in children. Special care may be needed. Overdosage: If you think you have taken too much of this medicine contact a poison control center or emergency room at  once. NOTE: This medicine is only for you. Do not share this medicine with others. What if I miss a dose? If you miss a dose, take it as soon as you can. If it is almost time for your next dose, take only that dose. Do not take double or extra doses. What may interact with this medicine? Do not take this medicine with any of the following medications: -cisapride -droperidol -flecainide -grepafloxacin -halofantrine -levomethadyl -MAOIs like Carbex, Eldepryl, Marplan, Nardil, and Parnate -nilotinib -pimozide -probucol -sertindole This medicine may also interact with the following medications: -abarelix -alcohol -contrast dyes -dolasetron -guanethidine -medicines for cancer -medicines for depression, anxiety, or psychotic disturbances -medicines to treat an irregular heartbeat -medicines used for sleep or numbness during surgery or procedure -methadone -octreotide -ondansetron -palonosetron -phenothiazines like chlorpromazine, mesoridazine, prochlorperazine, thioridazine -some medicines for infection like alfuzosin, chloroquine, clarithromycin, levofloxacin, mefloquine, pentamidine, troleandomycin -tramadol -vardenafil This list may not describe all possible interactions. Give your health care provider a list of all the medicines, herbs, non-prescription drugs, or dietary supplements you use. Also tell them if you smoke, drink alcohol, or use illegal drugs. Some items may interact with your medicine. What should I watch for while using this medicine? Check with your doctor or health care professional if your condition does not improve within 1 to 3 weeks. You may get drowsy or dizzy when you first start taking the medicine or change doses. Do not drive, use machinery, or do anything that may be dangerous until you know how the medicine affects you. Stand or sit up slowly. Your mouth may get dry. Drinking water, chewing sugarless gum, or sucking on hard candy may help. What side  effects may I notice from receiving this medicine? Side effects that you should report to your doctor or health care professional as soon as possible: -allergic reactions like skin rash, itching or hives, swelling of the face, lips, or tongue -chest pain -fast heartbeat -hallucinations -seizures -vomiting Side effects that usually do not require medical attention (report to your doctor or health care professional if they continue or are bothersome): -headache This list may not describe all possible side effects. Call your doctor for medical advice about side effects. You may report side effects to FDA at 1-800-FDA-1088. Where should I keep my medicine? Keep out of the reach of children. Store at room temperature between 15 and 30 degrees C (59 and 86 degrees F). Keep container tightly closed. Throw away any unused medicine after the expiration date. NOTE: This sheet is a summary. It may not cover all possible information. If you have questions about this medicine, talk to your doctor, pharmacist, or health care provider.  2012, Elsevier/[employer] Standard. (06/07/2007 10:26:21 PM)

## 2011-09-02 ENCOUNTER — Emergency Department (HOSPITAL_COMMUNITY)
Admission: EM | Admit: 2011-09-02 | Discharge: 2011-09-02 | Disposition: A | Discharge status: Home / Self Care | Payer: Self-pay | Attending: Emergency Medicine | Admitting: Emergency Medicine

## 2011-09-02 ENCOUNTER — Encounter (HOSPITAL_COMMUNITY): Payer: Self-pay

## 2011-09-02 ENCOUNTER — Emergency Department (HOSPITAL_COMMUNITY): Payer: Self-pay

## 2011-09-02 DIAGNOSIS — E669 Obesity, unspecified: Secondary | ICD-10-CM | POA: Insufficient documentation

## 2011-09-02 DIAGNOSIS — M129 Arthropathy, unspecified: Secondary | ICD-10-CM | POA: Insufficient documentation

## 2011-09-02 DIAGNOSIS — M5430 Sciatica, unspecified side: Secondary | ICD-10-CM | POA: Insufficient documentation

## 2011-09-02 HISTORY — DX: Unspecified osteoarthritis, unspecified site: M19.90

## 2011-09-02 MED ORDER — OXYCODONE-ACETAMINOPHEN 5-325 MG PO TABS
1.0000 | ORAL_TABLET | Freq: Once | ORAL | Status: AC
  Administered 2011-09-02: 1 via ORAL
  Filled 2011-09-02: qty 1

## 2011-09-02 MED ORDER — TRAMADOL HCL 50 MG PO TABS: 50.0000 mg | ORAL_TABLET | Freq: Four times a day (QID) | ORAL | Status: AC | PRN

## 2011-09-02 MED ORDER — PREDNISONE 10 MG PO TABS: ORAL_TABLET | ORAL | Status: DC

## 2011-09-02 NOTE — ED Notes (Signed)
Pt felt a pop in back around 2 weeks ago.  States it took his breath away.  Was seen at [facility], was given pain meds and it has not helped.  Pt unable to sleep at night d/t the pain. Pt c/o the low back hurting as well as pain running down his legs.

## 2011-09-02 NOTE — ED Provider Notes (Signed)
History     CSN: [id number]  Arrival date & time 09/02/11  1436   First MD Initiated Contact with Patient 09/02/11 1738      Chief Complaint  Patient presents with  . Back Pain    (Consider location/radiation/quality/duration/timing/severity/associated sxs/prior treatment) HPI History from patient. 36 year old male who presents with lower back pain. He states this started about a week ago when he twisted and felt something "pop" in his back. He has had severe pain to the lumbar area on both sides, worse on the left side, since. Pain does radiate into his bilateral legs, left greater than right. Pain is dull and achy in nature and fluctuates in intensity, made worse by movements. Denies any numbness, weakness, tingling. No bowel/bladder dysfunction. No urinary retention. He has not tried ice or heat. No history of back problems. Reviewof  the patient's previous chart indicates that he was seen for the same at [facility] on June 19 and given prescriptions for Percocet and Flexeril.  Past Medical History  Diagnosis Date  . Fluid retention in legs   . Arthritis     History reviewed. No pertinent past surgical history.  History reviewed. No pertinent family history.  History  Substance Use Topics  . Smoking status: Never Smoker   . Smokeless tobacco: Not on file  . Alcohol Use: Yes     ocassionally      Review of Systems as per HPI  Allergies  Penicillins and Dilaudid  Home Medications   Current Outpatient Rx  Name Route Sig Dispense Refill  . CYCLOBENZAPRINE HCL 10 MG PO TABS Oral Take 10 mg by mouth 3 (three) times daily as needed.    Marland Kitchen HYDROCHLOROTHIAZIDE 25 MG PO TABS Oral Take 25 mg by mouth daily.    . OXYCODONE-ACETAMINOPHEN 5-325 MG PO TABS Oral Take 1 tablet by mouth every 4 (four) hours as needed.      BP 129/82  Pulse 88  Temp 98.3 F (36.8 C) (Oral)  Resp 16  SpO2 98%  Physical Exam  Nursing note and vitals reviewed. Constitutional: He appears  well-developed and well-nourished. No distress.       obese  HENT:  Head: Normocephalic and atraumatic.  Neck: Normal range of motion.  Cardiovascular: Normal rate, regular rhythm and normal heart sounds.   Pulmonary/Chest: Effort normal and breath sounds normal. He exhibits no tenderness.  Abdominal: Soft. Bowel sounds are normal. There is no tenderness. There is no rebound and no guarding.  Musculoskeletal: Normal range of motion.       Spine: No palpable stepoff, crepitus, or gross deformity appreciated. No midline tenderness. Palp spasm to L paravertebral muscles with pain. Pos straight leg raise.   Neurological: He is alert.       Sensation grossly intact to lt touch and equal bilat to LEs, DP/PT pulses intact, 2+ achilles reflexes, ambulatory with nl gait  Skin: Skin is warm and dry. He is not diaphoretic.  Psychiatric: He has a normal mood and affect.    ED Course  Procedures (including critical care time)  Labs Reviewed - No data to display Dg Lumbar Spine Complete  09/02/2011  *RADIOLOGY REPORT*  Clinical Data: Turned the wrong way and felt a pop in his back 2 weeks ago, left side low back pain into middle of back, pain down both legs  LUMBAR SPINE - COMPLETE 4+ VIEW  Comparison: 12/27/2010  Findings: Five non-rib bearing lumbar vertebrae. Osseous mineralization normal. Minimally anterior endplate spur formation superior T12.  Lumbar vertebral body and disc space heights maintained. No acute fracture, subluxation or bone destruction. No spondylolysis. SI joints preserved.  IMPRESSION: No acute lumbar spine abnormalities. Minimal degenerative disc disease changes T11-T12.  Original Report Authenticated By: Lollie Marrow, M.D.     1. Sciatica       MDM  Patient with positive straight leg raise. Possible sciatica. Prescription for prednisone. Instructed followup with PCP if not improving. Return precautions discussed.       Grant Fontana, PA-C 09/02/11 1835

## 2011-09-02 NOTE — Discharge Instructions (Signed)
You may have irritation of the sciatic nerve. Please take the steroid as prescribed for the next several days. Follow up with your family doctor with increased pain, numbness/weakness in the legs, fever/chills, or any other worrisome symptoms.  Sciatica Sciatica is a weakness and/or changes in sensation (tingling, jolts, hot and cold, numbness) along the path the sciatic nerve travels. Irritation or damage to lumbar nerve roots is often also referred to as lumbar radiculopathy.  Lumbar radiculopathy (Sciatica) is the most common form of this problem. Radiculopathy can occur in any of the nerves coming out of the spinal cord. The problems caused depend on which nerves are involved. The sciatic nerve is the large nerve supplying the branches of nerves going from the hip to the toes. It often causes a numbness or weakness in the skin and/or muscles that the sciatic nerve serves. It also may cause symptoms (problems) of pain, burning, tingling, or electric shock-like feelings in the path of this nerve. This usually comes from injury to the fibers that make up the sciatic nerve. Some of these symptoms are low back pain and/or unpleasant feelings in the following areas:  From the mid-buttock down the back of the leg to the back of the knee.   And/or the outside of the calf and top of the foot.   And/or behind the inner ankle to the sole of the foot.  CAUSES   Herniated or slipped disc. Discs are the little cushions between the bones in the back.   Pressure by the piriformis muscle in the buttock on the sciatic nerve (Piriformis Syndrome).   Misalignment of the bones in the lower back and buttocks (Sacroiliac Joint Derangement).   Narrowing of the spinal canal that puts pressure on or pinches the fibers that make up the sciatic nerve.   A slipped vertebra that is out of line with those above or beneath it.   Abnormality of the nervous system itself so that nerve fibers do not transmit signals  properly, especially to feet and calves (neuropathy).   Tumor (this is rare).  Your caregiver can usually determine the cause of your sciatica and begin the treatment most likely to help you. TREATMENT  Taking over-the-counter painkillers, physical therapy, rest, exercise, spinal manipulation, and injections of anesthetics and/or steroids may be used. Surgery, acupuncture, and Yoga can also be effective. Mind over matter techniques, mental imagery, and changing factors such as your bed, chair, desk height, posture, and activities are other treatments that may be helpful. You and your caregiver can help determine what is best for you. With proper diagnosis, the cause of most sciatica can be identified and removed. Communication and cooperation between your caregiver and you is essential. If you are not successful immediately, do not be discouraged. With time, a proper treatment can be found that will make you comfortable. HOME CARE INSTRUCTIONS   If the pain is coming from a problem in the back, applying ice to that area for 15 to 20 minutes, 3 to 4 times per day while awake, may be helpful. Put the ice in a plastic bag. Place a towel between the bag of ice and your skin.   You may exercise or perform your usual activities if these do not aggravate your pain, or as suggested by your caregiver.   Only take over-the-counter or prescription medicines for pain, discomfort, or fever as directed by your caregiver.   If your caregiver has given you a follow-up appointment, it is very important to keep  that appointment. Not keeping the appointment could result in a chronic or permanent injury, pain, and disability. If there is any problem keeping the appointment, you must call back to this facility for assistance.  SEEK IMMEDIATE MEDICAL CARE IF:   You experience loss of control of bowel or bladder.   You have increasing weakness in the trunk, buttocks, or legs.   There is numbness in any areas from the  hip down to the toes.   You have difficulty walking or keeping your balance.   You have any of the above, with fever or forceful vomiting.  Document Released: 02/18/2001 Document Revised: 02/13/2011 Document Reviewed: 10/08/2007 [facility] Patient Information 2012 [place], [place].

## 2011-09-04 NOTE — ED Provider Notes (Signed)
Medical screening examination/treatment/procedure(s) were performed by non-physician practitioner and as supervising physician I was immediately available for consultation/collaboration.  Tamara Monteith T Rodrigues [place], MD 09/04/11 1746 

## 2011-11-18 ENCOUNTER — Emergency Department (HOSPITAL_COMMUNITY): Payer: Self-pay

## 2011-11-18 ENCOUNTER — Emergency Department (HOSPITAL_COMMUNITY)
Admission: EM | Admit: 2011-11-18 | Discharge: 2011-11-18 | Disposition: A | Discharge status: Home / Self Care | Payer: Self-pay | Attending: Emergency Medicine | Admitting: Emergency Medicine

## 2011-11-18 ENCOUNTER — Encounter (HOSPITAL_COMMUNITY): Payer: Self-pay | Admitting: *Deleted

## 2011-11-18 DIAGNOSIS — X500XXA Overexertion from strenuous movement or load, initial encounter: Secondary | ICD-10-CM | POA: Insufficient documentation

## 2011-11-18 DIAGNOSIS — S93409A Sprain of unspecified ligament of unspecified ankle, initial encounter: Secondary | ICD-10-CM | POA: Insufficient documentation

## 2011-11-18 DIAGNOSIS — Y9389 Activity, other specified: Secondary | ICD-10-CM | POA: Insufficient documentation

## 2011-11-18 DIAGNOSIS — Y998 Other external cause status: Secondary | ICD-10-CM | POA: Insufficient documentation

## 2011-11-18 MED ORDER — NAPROXEN 500 MG PO TABS: 500.0000 mg | ORAL_TABLET | Freq: Two times a day (BID) | ORAL | Status: DC | PRN

## 2011-11-18 MED ORDER — IBUPROFEN 400 MG PO TABS
400.0000 mg | ORAL_TABLET | Freq: Once | ORAL | Status: AC
  Administered 2011-11-18: 400 mg via ORAL
  Filled 2011-11-18: qty 1

## 2011-11-18 MED ORDER — HYDROCODONE-ACETAMINOPHEN 5-500 MG PO TABS: 1.0000 | ORAL_TABLET | Freq: Four times a day (QID) | ORAL | Status: AC | PRN

## 2011-11-18 MED ORDER — HYDROCODONE-ACETAMINOPHEN 5-325 MG PO TABS
2.0000 | ORAL_TABLET | Freq: Once | ORAL | Status: AC
  Administered 2011-11-18: 2 via ORAL
  Filled 2011-11-18: qty 2

## 2011-11-18 NOTE — ED Notes (Signed)
Pt requires bariatric crutches and no bariatric crutches are available at this time, EDP notified and new orders received

## 2011-11-18 NOTE — ED Notes (Signed)
Pt states that he twisted his left ankle this am when he missed a step. Cms intact distal

## 2011-11-25 NOTE — ED Provider Notes (Signed)
History    36 year old male with left ankle pain. Acute onset shortly before arrival. Patient twisted his ankle when he missed a stat. Persistent pain since. Can bear weight but with increased pain. No numbness or tingling. Denies any injury anywhere else.  CSN: [id number]  Arrival date & time 11/18/11  7829   First MD Initiated Contact with Patient 11/18/11 1951      Chief Complaint  Patient presents with  . Ankle Pain    (Consider location/radiation/quality/duration/timing/severity/associated sxs/prior treatment) HPI  Past Medical History  Diagnosis Date  . Fluid retention in legs   . Arthritis     History reviewed. No pertinent past surgical history.  No family history on file.  History  Substance Use Topics  . Smoking status: Never Smoker   . Smokeless tobacco: Not on file  . Alcohol Use: Yes     ocassionally      Review of Systems   Review of symptoms negative unless otherwise noted in HPI.   Allergies  Penicillins and Dilaudid  Home Medications   Current Outpatient Rx  Name Route Sig Dispense Refill  . HYDROCHLOROTHIAZIDE 25 MG PO TABS Oral Take 25 mg by mouth daily.    . IBUPROFEN 200 MG PO TABS Oral Take 800 mg by mouth daily as needed. For pain    . PREDNISONE 10 MG PO TABS  Take 6 tabs (60 mg) PO day one, 5 tabs day two, 4 tabs day three, 3 tabs day four, 2 tabs day five, 1 tab day six 21 tablet 0  . HYDROCODONE-ACETAMINOPHEN 5-500 MG PO TABS Oral Take 1-2 tablets by mouth every 6 (six) hours as needed for pain. 15 tablet 0  . NAPROXEN 500 MG PO TABS Oral Take 1 tablet (500 mg total) by mouth 2 (two) times daily as needed. 20 tablet 0    BP 148/90  Pulse 96  Temp 98.7 F (37.1 C) (Oral)  Resp 20  Ht 6' (1.829 m)  Wt 350 lb (158.759 kg)  BMI 47.47 kg/m2  SpO2 98%  Physical Exam  Nursing note and vitals reviewed. Constitutional: He appears well-developed and well-nourished. No distress.  HENT:  Head: Normocephalic and atraumatic.  Eyes:  Conjunctivae normal are normal. Right eye exhibits no discharge. Left eye exhibits no discharge.  Neck: Neck supple.  Cardiovascular: Normal rate, regular rhythm and normal heart sounds.  Exam reveals no gallop and no friction rub.   No murmur heard. Pulmonary/Chest: Effort normal and breath sounds normal. No respiratory distress.  Abdominal: Soft. He exhibits no distension. There is no tenderness.  Musculoskeletal: He exhibits no edema and no tenderness.       L ankle diffusely swollen. No ecchymosis. Tenderness over medial and lateral malleoli. NVI distally.  Neurological: He is alert.  Skin: Skin is warm and dry.  Psychiatric: He has a normal mood and affect. His behavior is normal. Thought content normal.    ED Course  Procedures (including critical care time)  Labs Reviewed - No data to display No results found.  Dg Ankle Complete Left  11/18/2011  *RADIOLOGY REPORT*  Clinical Data: Left ankle pain, swelling and lateral bruising following a fall.  LEFT ANKLE COMPLETE - 3+ VIEW  Comparison: None.  Findings: Diffuse soft tissue swelling, most pronounced laterally. No fracture, dislocation or effusion seen.  IMPRESSION: No fracture.   Original Report Authenticated By: Darrol Angel, M.D.     1. Ankle sprain       MDM  36yM with ankle  pain. xr neg for fx. Plan ankle support and prn pain meds. Return precautions discussed.        Raeford Razor, MD 11/25/11 [phone]

## 2012-02-22 ENCOUNTER — Encounter (HOSPITAL_COMMUNITY): Payer: Self-pay | Admitting: Emergency Medicine

## 2012-02-22 ENCOUNTER — Emergency Department (HOSPITAL_COMMUNITY)
Admission: EM | Admit: 2012-02-22 | Discharge: 2012-02-22 | Disposition: A | Discharge status: Home / Self Care | Payer: 59 | Attending: Emergency Medicine | Admitting: Emergency Medicine

## 2012-02-22 DIAGNOSIS — Z8739 Personal history of other diseases of the musculoskeletal system and connective tissue: Secondary | ICD-10-CM | POA: Insufficient documentation

## 2012-02-22 DIAGNOSIS — M7989 Other specified soft tissue disorders: Secondary | ICD-10-CM | POA: Insufficient documentation

## 2012-02-22 DIAGNOSIS — L0390 Cellulitis, unspecified: Secondary | ICD-10-CM

## 2012-02-22 DIAGNOSIS — L03119 Cellulitis of unspecified part of limb: Secondary | ICD-10-CM | POA: Insufficient documentation

## 2012-02-22 DIAGNOSIS — Z79899 Other long term (current) drug therapy: Secondary | ICD-10-CM | POA: Insufficient documentation

## 2012-02-22 DIAGNOSIS — L02419 Cutaneous abscess of limb, unspecified: Secondary | ICD-10-CM | POA: Insufficient documentation

## 2012-02-22 LAB — CBC WITH DIFFERENTIAL/PLATELET
Basophils Absolute: 0 10*3/uL (ref 0.0–0.1)
Basophils Relative: 0 % (ref 0–1)
Eosinophils Absolute: 0.1 10*3/uL (ref 0.0–0.7)
MCH: 28.3 pg (ref 26.0–34.0)
MCHC: 34.2 g/dL (ref 30.0–36.0)
Neutrophils Relative %: 67 % (ref 43–77)
Platelets: 222 10*3/uL (ref 150–400)
RDW: 12.9 % (ref 11.5–15.5)

## 2012-02-22 LAB — COMPREHENSIVE METABOLIC PANEL
AST: 23 U/L (ref 0–37)
Albumin: 3.5 g/dL (ref 3.5–5.2)
Alkaline Phosphatase: 78 U/L (ref 39–117)
BUN: 11 mg/dL (ref 6–23)
Potassium: 3.7 mEq/L (ref 3.5–5.1)
Sodium: 137 mEq/L (ref 135–145)
Total Protein: 7 g/dL (ref 6.0–8.3)

## 2012-02-22 MED ORDER — CLINDAMYCIN HCL 150 MG PO CAPS: 300.0000 mg | ORAL_CAPSULE | Freq: Four times a day (QID) | ORAL | Status: DC

## 2012-02-22 MED ORDER — CLINDAMYCIN PHOSPHATE 600 MG/4ML IJ SOLN
600.0000 mg | Freq: Once | INTRAMUSCULAR | Status: AC
  Administered 2012-02-22: 600 mg via INTRAMUSCULAR
  Filled 2012-02-22 (×2): qty 4

## 2012-02-22 MED ORDER — OXYCODONE-ACETAMINOPHEN 5-325 MG PO TABS: 2.0000 | ORAL_TABLET | ORAL | Status: DC | PRN

## 2012-02-22 NOTE — ED Notes (Signed)
Unable to locate medication.  Per pharmacy it was tubed to Pod B.  They will resend medication.  Pt moved to hallway due to trauma pt needing room.  Explained delay to pt and he verbalizes understanding.

## 2012-02-22 NOTE — ED Notes (Signed)
Patient with right leg pain.  Leg is red, warm to touch, swollen.  Patient states that now is having a hard time walking and hurts to touch.  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

## 2012-02-22 NOTE — ED Notes (Signed)
Pt states redness swelling on right leg since Friday, and that it seems like previous flare ups

## 2012-02-22 NOTE — ED Provider Notes (Signed)
History     CSN: [id number]  Arrival date & time 02/22/12  1933   First MD Initiated Contact with Patient 02/22/12 2117      Chief Complaint  Patient presents with  . Leg Pain    (Consider location/radiation/quality/duration/timing/severity/associated sxs/prior treatment) Patient is a 36 y.o. male presenting with leg pain. The history is provided by the patient.  Leg Pain    patient here with swelling and redness to his right lower extremity x2 days. No fever or chills. History of cellulitis in the past and this is similar. no recent trauma. No medications taken prior to arrival. Pain is worse with inhalation or movement and better with nothing  Past Medical History  Diagnosis Date  . Fluid retention in legs   . Arthritis     History reviewed. No pertinent past surgical history.  No family history on file.  History  Substance Use Topics  . Smoking status: Never Smoker   . Smokeless tobacco: Not on file  . Alcohol Use: Yes     Comment: ocassionally      Review of Systems  All other systems reviewed and are negative.    Allergies  Penicillins and Dilaudid  Home Medications   Current Outpatient Rx  Name  Route  Sig  Dispense  Refill  . HYDROCHLOROTHIAZIDE 25 MG PO TABS   Oral   Take 25 mg by mouth daily.         . IBUPROFEN 200 MG PO TABS   Oral   Take 800 mg by mouth daily as needed. For pain         . PREDNISONE 10 MG PO TABS   Oral   Take 10 mg by mouth daily.           BP 167/114  Pulse 98  Temp 98.7 F (37.1 C) (Oral)  Resp 20  SpO2 97%  Physical Exam  Nursing note and vitals reviewed. Constitutional: He is oriented to person, place, and time. He appears well-developed and well-nourished.  Non-toxic appearance. No distress.  HENT:  Head: Normocephalic and atraumatic.  Eyes: Conjunctivae normal, EOM and lids are normal. Pupils are equal, round, and reactive to light.  Neck: Normal range of motion. Neck supple. No tracheal  deviation present. No mass present.  Cardiovascular: Normal rate, regular rhythm and normal heart sounds.  Exam reveals no gallop.   No murmur heard. Pulmonary/Chest: Effort normal and breath sounds normal. No stridor. No respiratory distress. He has no decreased breath sounds. He has no wheezes. He has no rhonchi. He has no rales.  Abdominal: Soft. Normal appearance and bowel sounds are normal. He exhibits no distension. There is no tenderness. There is no rebound and no CVA tenderness.  Musculoskeletal: Normal range of motion. He exhibits no edema and no tenderness.       Right lower extremity with erythema and edema. No crepitus. Neurovascular intact  Neurological: He is alert and oriented to person, place, and time. He has normal strength. No cranial nerve deficit or sensory deficit. GCS eye subscore is 4. GCS verbal subscore is 5. GCS motor subscore is 6.  Skin: Skin is warm and dry. No abrasion and no rash noted.  Psychiatric: He has a normal mood and affect. His speech is normal and behavior is normal.    ED Course  Procedures (including critical care time)  Labs Reviewed  COMPREHENSIVE METABOLIC PANEL - Abnormal; Notable for the following:    Glucose, Bld 132 (*)  All other components within normal limits  CBC WITH DIFFERENTIAL   No results found.   No diagnosis found.    MDM  Patient given IM dose of clindamycin here. Will be placed on clindamycin due to his penicillin allergy and treated for his cellulitis       Toy Baker, MD 02/22/12 2135

## 2012-02-22 NOTE — ED Notes (Signed)
Pt brought to Pod C for antibiotic administration.  Informed pt that he would need to wait 20 minutes after receiving the shot to make sure he doesn't have a reaction.  Pt verbalizes understanding.

## 2012-02-22 NOTE — ED Notes (Signed)
Pt offered drink.  Pt given a diet coke.  Ambulatory to bathroom without any difficulty.

## 2012-02-22 NOTE — ED Notes (Signed)
Pt given discharge instructions.  Waiting for medications from pharmacy.

## 2012-09-18 IMAGING — CR DG LUMBAR SPINE COMPLETE 4+V
5 series · 5 of 5 positions shown · non-contrast
Comparison: CT of the pelvis performed 05/02/2010

CLINICAL DATA: Lower back and pelvic pain for 3 weeks.

LUMBAR SPINE - COMPLETE 4+ VIEW

[t lumbar spine ap]
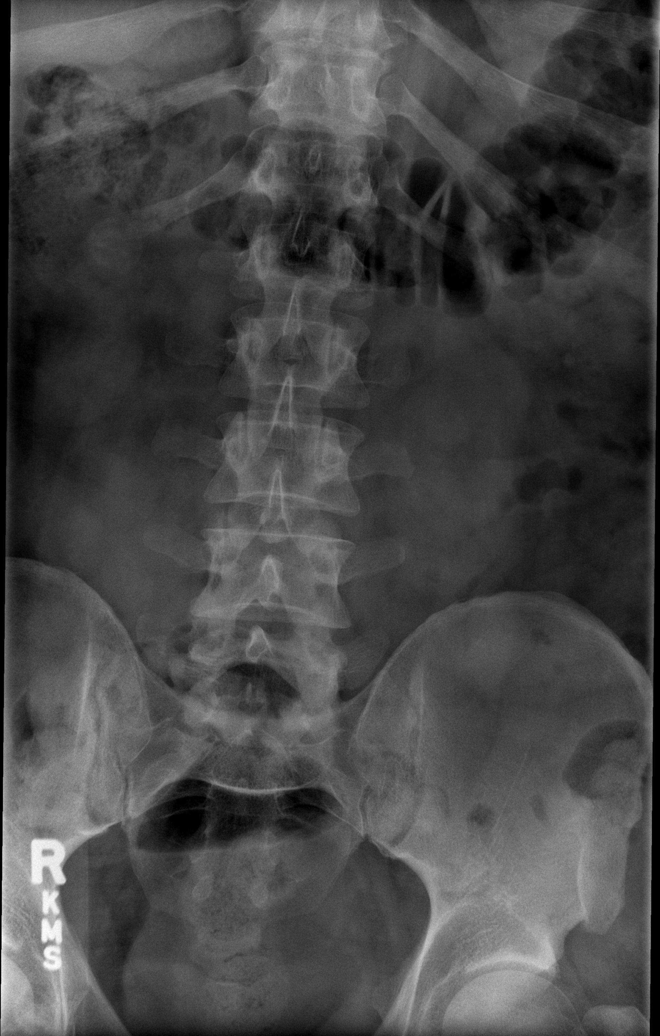

[t lumbar spine obl (1 of 2)]
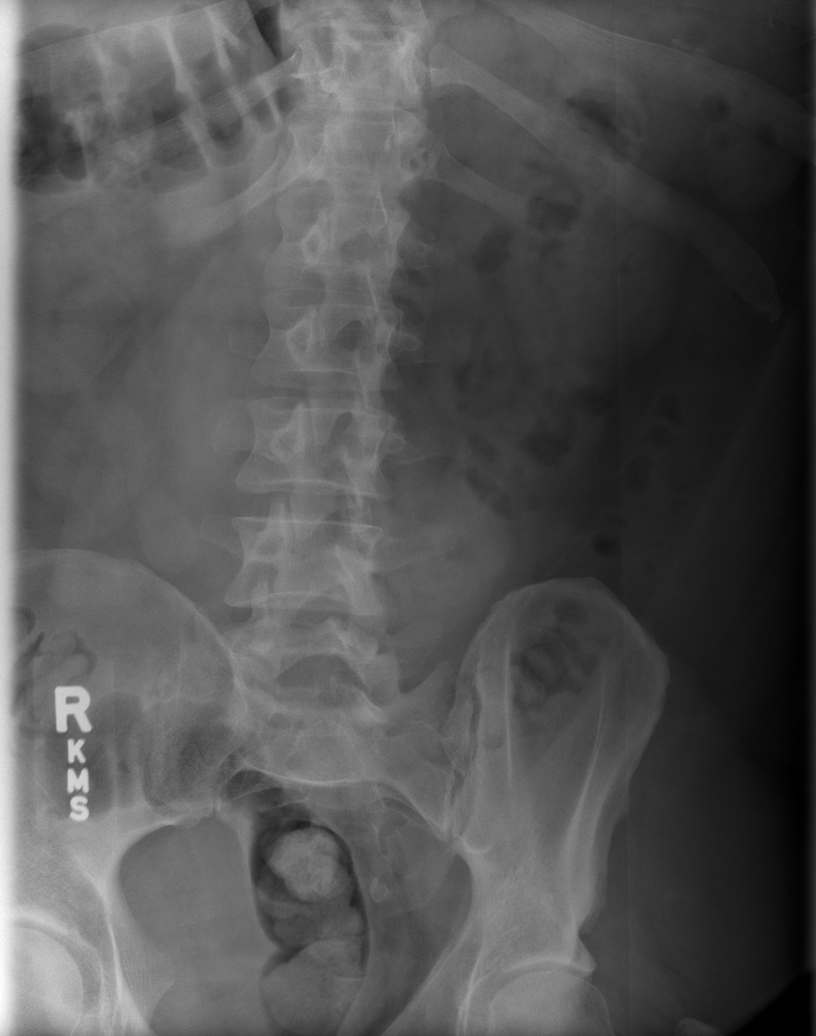

[t lumbar spine obl (2 of 2)]
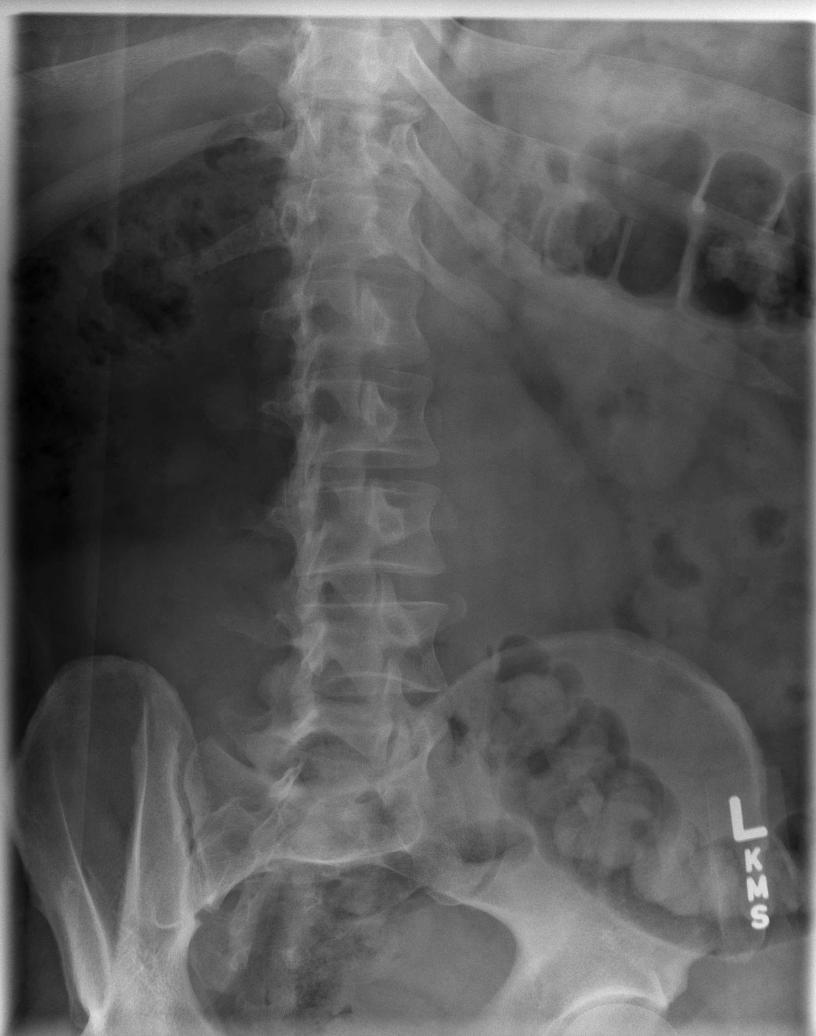

[t lumbar spine lat]
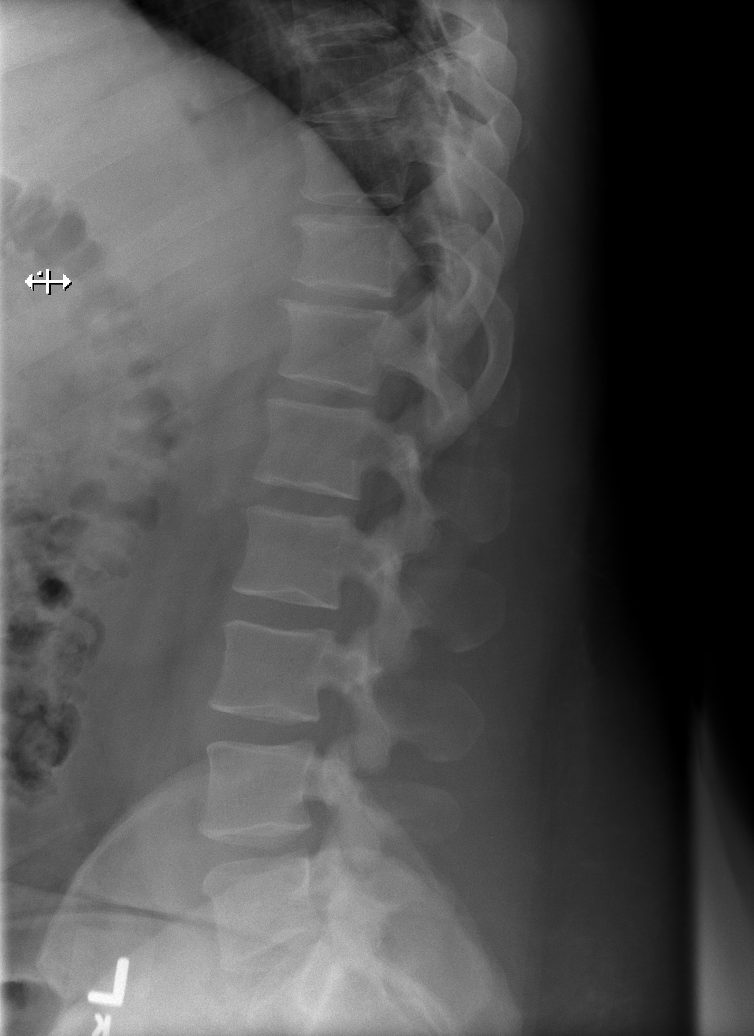

[t lumbar l-5 s-1 spot]
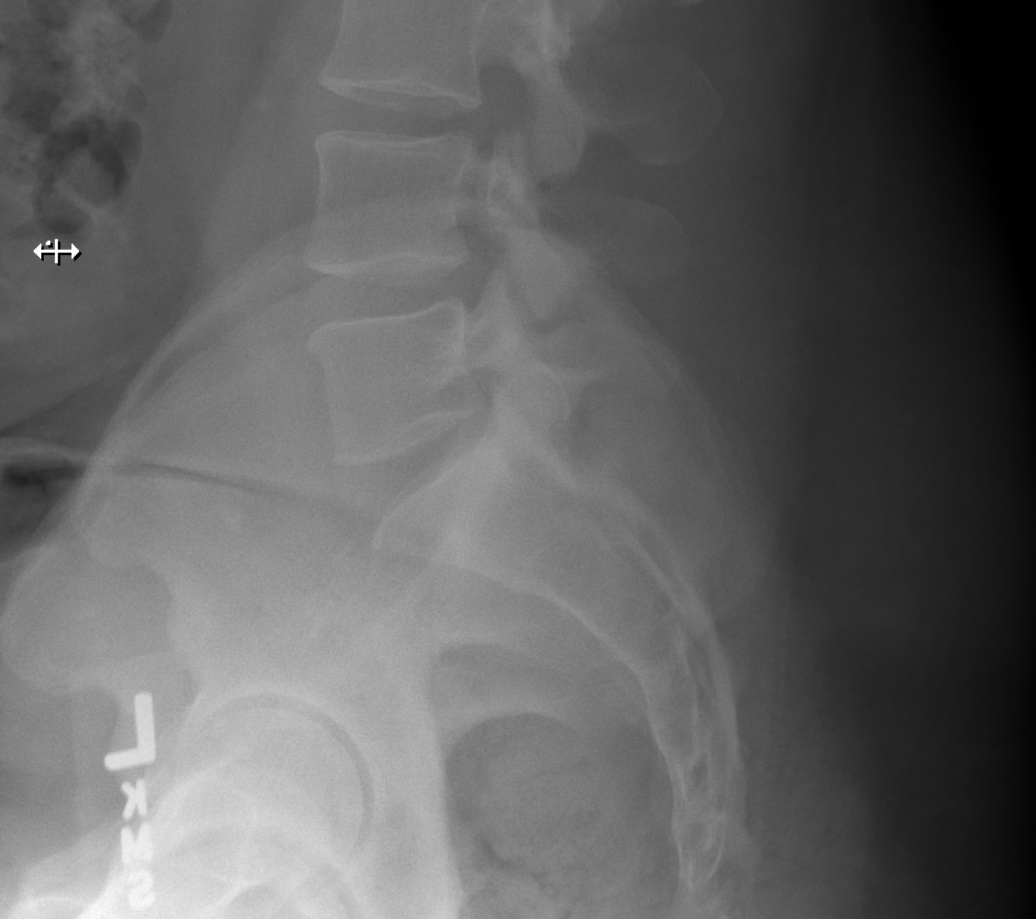

[5 of 5 positions shown; findings below may reference images not displayed]

FINDINGS: There is no evidence of fracture or subluxation.
Vertebral bodies demonstrate normal height and alignment.
Intervertebral disc spaces are preserved.  The visualized neural
foramina are grossly unremarkable in appearance.

The visualized bowel gas pattern is unremarkable in appearance; air
and stool are noted within the colon.  The sacroiliac joints are
within normal limits.
IMPRESSION: No evidence of fracture or subluxation along the lumbar spine.

## 2012-11-02 ENCOUNTER — Emergency Department (HOSPITAL_COMMUNITY)
Admission: EM | Admit: 2012-11-02 | Discharge: 2012-11-02 | Disposition: A | Discharge status: Home / Self Care | Payer: 59 | Attending: Emergency Medicine | Admitting: Emergency Medicine

## 2012-11-02 ENCOUNTER — Emergency Department (HOSPITAL_COMMUNITY): Payer: 59

## 2012-11-02 ENCOUNTER — Encounter (HOSPITAL_COMMUNITY): Payer: Self-pay | Admitting: Emergency Medicine

## 2012-11-02 DIAGNOSIS — M129 Arthropathy, unspecified: Secondary | ICD-10-CM | POA: Insufficient documentation

## 2012-11-02 DIAGNOSIS — Z872 Personal history of diseases of the skin and subcutaneous tissue: Secondary | ICD-10-CM | POA: Insufficient documentation

## 2012-11-02 DIAGNOSIS — M7730 Calcaneal spur, unspecified foot: Secondary | ICD-10-CM | POA: Insufficient documentation

## 2012-11-02 DIAGNOSIS — M7732 Calcaneal spur, left foot: Secondary | ICD-10-CM

## 2012-11-02 DIAGNOSIS — M722 Plantar fascial fibromatosis: Secondary | ICD-10-CM | POA: Insufficient documentation

## 2012-11-02 DIAGNOSIS — IMO0002 Reserved for concepts with insufficient information to code with codable children: Secondary | ICD-10-CM | POA: Insufficient documentation

## 2012-11-02 DIAGNOSIS — M25579 Pain in unspecified ankle and joints of unspecified foot: Secondary | ICD-10-CM | POA: Insufficient documentation

## 2012-11-02 DIAGNOSIS — Z79899 Other long term (current) drug therapy: Secondary | ICD-10-CM | POA: Insufficient documentation

## 2012-11-02 DIAGNOSIS — Z880 Allergy status to penicillin: Secondary | ICD-10-CM | POA: Insufficient documentation

## 2012-11-02 DIAGNOSIS — I10 Essential (primary) hypertension: Secondary | ICD-10-CM | POA: Insufficient documentation

## 2012-11-02 HISTORY — DX: Psoriasis, unspecified: L40.9

## 2012-11-02 HISTORY — DX: Essential (primary) hypertension: I10

## 2012-11-02 MED ORDER — DICLOFENAC SODIUM 75 MG PO TBEC: 75.0000 mg | DELAYED_RELEASE_TABLET | Freq: Two times a day (BID) | ORAL | Status: DC

## 2012-11-02 MED ORDER — HYDROCODONE-ACETAMINOPHEN 7.5-325 MG PO TABS: 1.0000 | ORAL_TABLET | ORAL | Status: DC | PRN

## 2012-11-02 NOTE — ED Provider Notes (Signed)
CSN: [id number]     Arrival date & time 11/02/12  2028 History   First MD Initiated Contact with Patient 11/02/12 2059     Chief Complaint  Patient presents with  . Foot Pain   (Consider location/radiation/quality/duration/timing/severity/associated sxs/prior Treatment) Patient is a 37 y.o. male presenting with lower extremity pain. The history is provided by the patient.  Foot Pain This is a new problem. The current episode started yesterday. The problem occurs constantly. The problem has been unchanged. Associated symptoms include arthralgias. Pertinent negatives include no abdominal pain, chest pain, coughing or neck pain. The symptoms are aggravated by standing and walking. He has tried nothing for the symptoms. The treatment provided no relief.    Past Medical History  Diagnosis Date  . Fluid retention in legs   . Arthritis   . Psoriasis   . Hypertension    History reviewed. No pertinent past surgical history. Family History  Problem Relation Age of Onset  . Pulmonary fibrosis Father    History  Substance Use Topics  . Smoking status: Never Smoker   . Smokeless tobacco: Not on file  . Alcohol Use: 4.8 oz/week    8 Cans of beer per week    Review of Systems  Constitutional: Negative for activity change.       All ROS Neg except as noted in HPI  HENT: Negative for nosebleeds and neck pain.   Eyes: Negative for photophobia and discharge.  Respiratory: Negative for cough, shortness of breath and wheezing.   Cardiovascular: Negative for chest pain and palpitations.  Gastrointestinal: Negative for abdominal pain and blood in stool.  Genitourinary: Negative for dysuria, frequency and hematuria.  Musculoskeletal: Positive for arthralgias. Negative for back pain.  Skin: Negative.   Neurological: Negative for dizziness, seizures and speech difficulty.  Psychiatric/Behavioral: Negative for hallucinations and confusion.    Allergies  Penicillins and Dilaudid  Home  Medications   Current Outpatient Rx  Name  Route  Sig  Dispense  Refill  . amLODipine (NORVASC) 5 MG tablet   Oral   Take 5 mg by mouth daily.         . folic acid (FOLVITE) 1 MG tablet   Oral   Take 1 mg by mouth daily.         Marland Kitchen lisinopril-hydrochlorothiazide (PRINZIDE,ZESTORETIC) 20-25 MG per tablet   Oral   Take 1 tablet by mouth daily.         . clindamycin (CLEOCIN) 150 MG capsule   Oral   Take 2 capsules (300 mg total) by mouth 4 (four) times daily.   56 capsule   0   . diclofenac (VOLTAREN) 75 MG EC tablet   Oral   Take 1 tablet (75 mg total) by mouth 2 (two) times daily.   14 tablet   0   . hydrochlorothiazide (HYDRODIURIL) 25 MG tablet   Oral   Take 25 mg by mouth daily.         Marland Kitchen HYDROcodone-acetaminophen (NORCO) 7.5-325 MG per tablet   Oral   Take 1 tablet by mouth every 4 (four) hours as needed for pain.   20 tablet   0   . ibuprofen (ADVIL,MOTRIN) 200 MG tablet   Oral   Take 800 mg by mouth daily as needed. For pain         . oxyCODONE-acetaminophen (PERCOCET/ROXICET) 5-325 MG per tablet   Oral   Take 2 tablets by mouth every 4 (four) hours as needed for pain.  12 tablet   0   . predniSONE (DELTASONE) 10 MG tablet   Oral   Take 10 mg by mouth daily.          BP 158/96  Pulse 93  Temp(Src) 99.1 F (37.3 C) (Oral)  Resp 24  Ht 6' (1.829 m)  Wt 380 lb (172.367 kg)  BMI 51.53 kg/m2  SpO2 97% Physical Exam  Nursing note and vitals reviewed. Constitutional: He is oriented to person, place, and time. He appears well-developed and well-nourished.  Non-toxic appearance.  HENT:  Head: Normocephalic.  Right Ear: Tympanic membrane and external ear normal.  Left Ear: Tympanic membrane and external ear normal.  Eyes: EOM and lids are normal. Pupils are equal, round, and reactive to light.  Neck: Normal range of motion. Neck supple. Carotid bruit is not present.  Cardiovascular: Normal rate, regular rhythm, normal heart sounds, intact  distal pulses and normal pulses.   Pulmonary/Chest: Breath sounds normal. No respiratory distress.  Abdominal: Soft. Bowel sounds are normal. There is no tenderness. There is no guarding.  Musculoskeletal:       Feet:  Lymphadenopathy:       Head (right side): No submandibular adenopathy present.       Head (left side): No submandibular adenopathy present.    He has no cervical adenopathy.  Neurological: He is alert and oriented to person, place, and time. He has normal strength. No cranial nerve deficit or sensory deficit.  Skin: Skin is warm and dry.  Psychiatric: He has a normal mood and affect. His speech is normal.    ED Course  Procedures (including critical care time) Labs Review Labs Reviewed - No data to display Imaging Review Dg Foot Complete Left  11/02/2012   *RADIOLOGY REPORT*  Clinical Data: Pain  LEFT FOOT - COMPLETE 3+ VIEW  Comparison: 06/10/2012  Findings: Small calcaneal spur at the plantar aponeurosis. Negative for fracture, dislocation, or other acute abnormality.  Normal alignment and mineralization. No other significant degenerative change.  Regional soft tissues unremarkable.  IMPRESSION:  Small calcaneal spur.  Otherwise negative.   Original Report Authenticated By: D. Andria Rhein, MD    MDM   1. Plantar fasciitis of left foot   2. Heel spur, left    **I have reviewed nursing notes, vital signs, and all appropriate lab and imaging results for this patient.*  Has hx of foot pain, but pain today and last night are more intense. Xray of the food reveals calcaneal spur. Suspect plantar fasciitis. Pt referred to orthopedics. Rx for voltaren and norco given to the patient.  Kathie Dike, PA-C 11/04/12 [phone]

## 2012-11-02 NOTE — ED Notes (Signed)
Pt reports he stepped down on his heel and "felt like something pulled apart in his arch."

## 2012-11-04 NOTE — ED Provider Notes (Signed)
Medical screening examination/treatment/procedure(s) were performed by non-physician practitioner and as supervising physician I was immediately available for consultation/collaboration.   Benny Lennert, MD 11/04/12 [phone]

## 2013-04-15 DIAGNOSIS — I10 Essential (primary) hypertension: Secondary | ICD-10-CM | POA: Insufficient documentation

## 2013-05-14 ENCOUNTER — Encounter (HOSPITAL_COMMUNITY): Payer: Self-pay | Admitting: Emergency Medicine

## 2013-05-14 ENCOUNTER — Emergency Department (HOSPITAL_COMMUNITY)
Admission: EM | Admit: 2013-05-14 | Discharge: 2013-05-14 | Disposition: A | Discharge status: Home / Self Care | Payer: 59 | Attending: Emergency Medicine | Admitting: Emergency Medicine

## 2013-05-14 DIAGNOSIS — G8929 Other chronic pain: Secondary | ICD-10-CM | POA: Insufficient documentation

## 2013-05-14 DIAGNOSIS — M549 Dorsalgia, unspecified: Secondary | ICD-10-CM

## 2013-05-14 DIAGNOSIS — Z880 Allergy status to penicillin: Secondary | ICD-10-CM | POA: Insufficient documentation

## 2013-05-14 DIAGNOSIS — M545 Low back pain, unspecified: Secondary | ICD-10-CM | POA: Insufficient documentation

## 2013-05-14 DIAGNOSIS — L509 Urticaria, unspecified: Secondary | ICD-10-CM | POA: Insufficient documentation

## 2013-05-14 DIAGNOSIS — R21 Rash and other nonspecific skin eruption: Secondary | ICD-10-CM | POA: Insufficient documentation

## 2013-05-14 DIAGNOSIS — I10 Essential (primary) hypertension: Secondary | ICD-10-CM | POA: Insufficient documentation

## 2013-05-14 DIAGNOSIS — R209 Unspecified disturbances of skin sensation: Secondary | ICD-10-CM | POA: Insufficient documentation

## 2013-05-14 DIAGNOSIS — M25569 Pain in unspecified knee: Secondary | ICD-10-CM | POA: Insufficient documentation

## 2013-05-14 DIAGNOSIS — M129 Arthropathy, unspecified: Secondary | ICD-10-CM | POA: Insufficient documentation

## 2013-05-14 DIAGNOSIS — Z79899 Other long term (current) drug therapy: Secondary | ICD-10-CM | POA: Insufficient documentation

## 2013-05-14 DIAGNOSIS — Z872 Personal history of diseases of the skin and subcutaneous tissue: Secondary | ICD-10-CM | POA: Insufficient documentation

## 2013-05-14 MED ORDER — KETOROLAC TROMETHAMINE 60 MG/2ML IM SOLN
60.0000 mg | Freq: Once | INTRAMUSCULAR | Status: AC
  Administered 2013-05-14: 60 mg via INTRAMUSCULAR
  Filled 2013-05-14: qty 2

## 2013-05-14 MED ORDER — OXYCODONE-ACETAMINOPHEN 5-325 MG PO TABS: 1.0000 | ORAL_TABLET | ORAL | Status: DC | PRN

## 2013-05-14 MED ORDER — METHYLPREDNISOLONE SODIUM SUCC 125 MG IJ SOLR
125.0000 mg | Freq: Once | INTRAMUSCULAR | Status: AC
  Administered 2013-05-14: 125 mg via INTRAMUSCULAR
  Filled 2013-05-14: qty 2

## 2013-05-14 NOTE — ED Notes (Signed)
Pt c/o itchy rash on neck, chest, arms, and legs.  Also c/o back pain x 2 years that is worse this week.

## 2013-05-14 NOTE — ED Provider Notes (Signed)
Medical screening examination/treatment/procedure(s) were performed by non-physician practitioner and as supervising physician I was immediately available for consultation/collaboration.   EKG Interpretation None        Rolan BuccoMelanie Nashaun Hillmer, MD 05/14/13 1452

## 2013-05-14 NOTE — Discharge Instructions (Signed)
Take the prescribed medication as directed.  May also take benadryl as needed to help with rash and itching. Follow-up with your primary care physician if problems occur or no improvement in the next several days. Return to the ED for new or worsening symptoms.

## 2013-05-14 NOTE — ED Provider Notes (Signed)
CSN: [id number]     Arrival date & time 05/14/13  1238 History  This chart was scribed for non-physician practitioner working with Rolan BuccoMelanie Belfi, MD by Ashley JacobsBrittany Andrews, ED scribe. This patient was seen in room WTR9/WTR9 and the patient's care was started at 1:12 PM.   First MD Initiated Contact with Patient 05/14/13 1300     Chief Complaint  Patient presents with  . Rash  . Back Pain     (Consider location/radiation/quality/duration/timing/severity/associated sxs/prior Treatment) The history is provided by the patient and medical records. No language interpreter was used.   HPI Comments: [name] is a 38 y.o. male who comes to the Emergency Department complaining of constant, moderate itchy rash to his neck, arms (mostly concentrated near his axilla), chest and legs that presented this morning. Pt has tried two benadryl to relieve the rash but nothing seems to help it. 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 He is unsure if they changed the brand of the salt melt. Pt denies having any prior similar symptoms. Denies new soaps, detergents, clothes, foods and hotel stay.   No fevers or chills.  Pt also complains of chronic back pain that presented two years ago which has been getting worse for the past week. He has hx of sciatica which affect his lower back but he mentions that the pain now radiates through down his leg to his left knee. He experiences intermittent paresthesias of LE.  denies numbness.  No loss of bowel or bladder control.  Due to pain pt is unable to walk long distances. Nothing seems to resolve his symptoms. Pt has allergies to Penicillins and Dilaudid.  Pt's PCP is at [facility] in [place].  Past Medical History  Diagnosis Date  . Fluid retention in legs   . Arthritis   . Psoriasis   . Hypertension    History reviewed. No pertinent past surgical history. Family History  Problem Relation Age of Onset  . Pulmonary  fibrosis Father    History  Substance Use Topics  . Smoking status: Never Smoker   . Smokeless tobacco: Not on file  . Alcohol Use: 4.8 oz/week    8 Cans of beer per week    Review of Systems  Musculoskeletal: Positive for arthralgias, back pain, gait problem and myalgias.  Skin: Positive for rash (neck, torso, arms and legs).  Neurological: Positive for numbness.  All other systems reviewed and are negative.      Allergies  Penicillins and Dilaudid  Home Medications   Current Outpatient Rx  Name  Route  Sig  Dispense  Refill  . amLODipine (NORVASC) 5 MG tablet   Oral   Take 5 mg by mouth daily.         . folic acid (FOLVITE) 1 MG tablet   Oral   Take 1 mg by mouth daily.         Marland Kitchen. GLUCOSAMINE HCL PO   Oral   Take 2,000 mg by mouth at bedtime as needed (joint pain).         Marland Kitchen. lisinopril-hydrochlorothiazide (PRINZIDE,ZESTORETIC) 20-25 MG per tablet   Oral   Take 1 tablet by mouth daily.         . naproxen sodium (ANAPROX) 220 MG tablet   Oral   Take 660 mg by mouth 2 (two) times daily as needed (pain).         Marland Kitchen. omega-3 acid ethyl esters (  LOVAZA) 1 G capsule   Oral   Take 1 g by mouth 2 (two) times daily.          BP 158/80  Pulse 101  Temp(Src) 98.1 F (36.7 C) (Oral)  Resp 20  SpO2 98%  Physical Exam  Nursing note and vitals reviewed. Constitutional: He is oriented to person, place, and time. He appears well-developed and well-nourished. No distress.  HENT:  Head: Normocephalic and atraumatic.  Mouth/Throat: Oropharynx is clear and moist.  Eyes: Conjunctivae and EOM are normal. Pupils are equal, round, and reactive to light.  Neck: Normal range of motion. Neck supple.  Cardiovascular: Normal rate, regular rhythm and normal heart sounds.   Pulmonary/Chest: Effort normal and breath sounds normal. No respiratory distress. He has no wheezes.  Abdominal: Soft. Bowel sounds are normal. There is no tenderness. There is no guarding.   Musculoskeletal: Normal range of motion.       Lumbar back: He exhibits pain. He exhibits normal range of motion, no tenderness, no bony tenderness, no deformity and no spasm.  Pain of left lumbar paraspinal region; no midline tenderness, step-off, or deformities; full ROM maintained; distal sensation intact; ambulating unassisted without difficulty  Neurological: He is alert and oriented to person, place, and time.  Skin: Skin is warm. Rash noted. Rash is maculopapular and urticarial. He is not diaphoretic.  Rash of trunk, posterior neck, and BUE with intermixed urticarial and maculopapular lesions; no vesicles, pustules, or petechiae; some lesions with excoriation secondary to scratching; no superimposed infection or cellulitic changes  Psychiatric: He has a normal mood and affect.    ED Course  Procedures (including critical care time) DIAGNOSTIC STUDIES: Oxygen Saturation is 98% on room air, normal by my interpretation.    COORDINATION OF CARE:  1:16 PM Discussed course of care with pt which includes Toradol and Solu-Medrol. Pt understands and agrees.   Labs Review Labs Reviewed - No data to display Imaging Review No results found.   EKG Interpretation None      MDM   Final diagnoses:  Rash and other nonspecific skin eruption  Back pain   Nonspecific rash consistent with contact dermatitis. Atraumatic exacerbation of chronic back pain. No signs or symptoms concerning for cauda equina. Patient given a shot of Toradol and Solu-Medrol. Instructed to continue over-the-counter Benadryl.  He will FU with his PCP next week.  Discussed plan with pt, they agreed.  Return precautions advised.  I personally performed the services described in this documentation, which was scribed in my presence. The recorded information has been reviewed and is accurate.     Garlon Hatchet, PA-C 05/14/13 1450

## 2013-05-15 ENCOUNTER — Emergency Department (HOSPITAL_COMMUNITY)
Admission: EM | Admit: 2013-05-15 | Discharge: 2013-05-15 | Disposition: A | Discharge status: Home / Self Care | Payer: 59 | Attending: Emergency Medicine | Admitting: Emergency Medicine

## 2013-05-15 ENCOUNTER — Encounter (HOSPITAL_COMMUNITY): Payer: Self-pay | Admitting: Emergency Medicine

## 2013-05-15 DIAGNOSIS — I10 Essential (primary) hypertension: Secondary | ICD-10-CM | POA: Insufficient documentation

## 2013-05-15 DIAGNOSIS — Z880 Allergy status to penicillin: Secondary | ICD-10-CM | POA: Insufficient documentation

## 2013-05-15 DIAGNOSIS — Z79899 Other long term (current) drug therapy: Secondary | ICD-10-CM | POA: Insufficient documentation

## 2013-05-15 DIAGNOSIS — R21 Rash and other nonspecific skin eruption: Secondary | ICD-10-CM | POA: Insufficient documentation

## 2013-05-15 DIAGNOSIS — Z872 Personal history of diseases of the skin and subcutaneous tissue: Secondary | ICD-10-CM | POA: Insufficient documentation

## 2013-05-15 DIAGNOSIS — G479 Sleep disorder, unspecified: Secondary | ICD-10-CM | POA: Insufficient documentation

## 2013-05-15 DIAGNOSIS — M129 Arthropathy, unspecified: Secondary | ICD-10-CM | POA: Insufficient documentation

## 2013-05-15 DIAGNOSIS — R000 Tachycardia, unspecified: Secondary | ICD-10-CM | POA: Insufficient documentation

## 2013-05-15 MED ORDER — PREDNISONE 10 MG PO TABS: ORAL_TABLET | ORAL | Status: DC

## 2013-05-15 MED ORDER — PREDNISONE 20 MG PO TABS
60.0000 mg | ORAL_TABLET | Freq: Once | ORAL | Status: AC
  Administered 2013-05-15: 60 mg via ORAL
  Filled 2013-05-15: qty 3

## 2013-05-15 NOTE — ED Provider Notes (Signed)
Patient with diffuse pruritic rash over trunk and extremities. Worse since yesterday. Thinks it's secondary to contact with poison oak. Patient is not ill-appearing or shortness of breath. No fever. Patient is alert morbidly obese not acutely ill-appearing rash with a diffuse pinkish r papular ash over trunk and extremities. No mucosal lesions.  Doug SouSam Warner Laduca, MD 05/15/13 Ernestina [id number]

## 2013-05-15 NOTE — ED Provider Notes (Signed)
Medical screening examination/treatment/procedure(s) were conducted as a shared visit with non-physician practitioner(s) and myself.  I personally evaluated the patient during the encounter.   EKG Interpretation None       Doug SouSam Kyrstal Monterrosa, MD 05/15/13 2330

## 2013-05-15 NOTE — Discharge Instructions (Signed)
Take prednisone as instructed.  Continue taking benadryl and/or pepcid/zantac to help with itching. Follow up with dermatology-- referral provided.

## 2013-05-15 NOTE — ED Provider Notes (Signed)
CSN: [id number]     Arrival date & time 05/15/13  1845 History   This chart was scribed for non-physician practitioner Sharilyn SitesLisa Ekansh Sherk, PA-C,working with Doug SouSam Jacubowitz, MD, by Yevette EdwardsAngela Bracken, ED Scribe. This patient was seen in room WTR7/WTR7 and the patient's care was started at 7:00 PM. First MD Initiated Contact with Patient 05/15/13 1858     Chief Complaint  Patient presents with  . Rash    The history is provided by the patient. No language interpreter was used.   HPI Comments: [name] is a 38 y.o. male who presents to the Emergency Department complaining of a worsening, rash which he first noticed yesterday morning. Pt was seen in the ED yesterday by myself, given solu-medrol and benadryl, instructed to continue benadryl and H2 blockers at home.  He states he now recalls working in the woods a few days ago and may have been exposed to poison oak.  States today rash has spread, now on his bilateral thighs, scalp, and he feels that he may have oral lesions. He reports increased itching associated with the rash, reporting the rash prevented him from sleeping well.  States he also applied white shoe polish to lesions without noted improvement.  Denies fevers or chills.  No sensation of throat swelling or SOB.  Pt tachycardic and hypertensive on arrival.  Past Medical History  Diagnosis Date  . Fluid retention in legs   . Arthritis   . Psoriasis   . Hypertension    History reviewed. No pertinent past surgical history. Family History  Problem Relation Age of Onset  . Pulmonary fibrosis Father    History  Substance Use Topics  . Smoking status: Never Smoker   . Smokeless tobacco: Not on file  . Alcohol Use: 4.8 oz/week    8 Cans of beer per week    Review of Systems  Constitutional: Negative for fever.  Skin: Positive for rash.  All other systems reviewed and are negative.   Allergies  Penicillins and Dilaudid  Home Medications   Current Outpatient Rx  Name  Route  Sig   Dispense  Refill  . amLODipine (NORVASC) 5 MG tablet   Oral   Take 5 mg by mouth daily.         . folic acid (FOLVITE) 1 MG tablet   Oral   Take 1 mg by mouth daily.         Marland Kitchen. GLUCOSAMINE HCL PO   Oral   Take 2,000 mg by mouth at bedtime as needed (joint pain).         Marland Kitchen. lisinopril-hydrochlorothiazide (PRINZIDE,ZESTORETIC) 20-25 MG per tablet   Oral   Take 1 tablet by mouth daily.         . naproxen sodium (ANAPROX) 220 MG tablet   Oral   Take 660 mg by mouth 2 (two) times daily as needed (pain).         Marland Kitchen. omega-3 acid ethyl esters (LOVAZA) 1 G capsule   Oral   Take 1 g by mouth 2 (two) times daily.         Marland Kitchen. oxyCODONE-acetaminophen (PERCOCET/ROXICET) 5-325 MG per tablet   Oral   Take 1 tablet by mouth every 4 (four) hours as needed.   15 tablet   0    Triage Vitals: BP 166/86  Pulse 115  Temp(Src) 98.8 F (37.1 C) (Oral)  Resp 24  SpO2 97%  Physical Exam  Nursing note and vitals reviewed. Constitutional: He is oriented  to person, place, and time. He appears well-developed and well-nourished. No distress.  Morbidly obese  HENT:  Head: Normocephalic and atraumatic.  Mouth/Throat: Uvula is midline, oropharynx is clear and moist and mucous membranes are normal. No oral lesions. No posterior oropharyngeal edema.  No oral lesions identified; no edema; airway patent; handling secretions appropriately  Eyes: Conjunctivae and EOM are normal. Pupils are equal, round, and reactive to light.  Neck: Normal range of motion. Neck supple. No tracheal deviation present.  Cardiovascular: Normal rate, regular rhythm and normal heart sounds.   Pulmonary/Chest: Effort normal and breath sounds normal. No respiratory distress.  Abdominal: Soft. Bowel sounds are normal.  Musculoskeletal: Normal range of motion.  Neurological: He is alert and oriented to person, place, and time.  Skin: Skin is warm and dry. Rash noted.  Rash to trunk, posterior neck, BUE and BUE with  intermixed urticarial and maculopapular lesions, worse in axillae and folds of neck-- more widespread than yesterday.  No vesicles, pustules, or petechiae. Some lesions with excoriation secondary to scratching. No superimposed infection or cellulitic changes.  Psychiatric: He has a normal mood and affect. His behavior is normal.    ED Course  Procedures (including critical care time)  DIAGNOSTIC STUDIES: Oxygen Saturation is 97% on room air, normal by my interpretation.    COORDINATION OF CARE:  7:04 PM- Discussed treatment plan with patient, and the patient agreed to the plan.   7:05 PM- Consulted with Dr. Ethelda Chick regarding the pt's care.  7:06 PM- Dr. Ethelda Chick assessed the pt and recommends extended steroid taper. Labs Review Labs Reviewed - No data to display Imaging Review No results found.   EKG Interpretation None      MDM   Final diagnoses:  Rash and nonspecific skin eruption   I personally evaluated pt yesterday.  Rash appears the same is nature, but is more widespread today.  Pt given dose of prednisone in the ED, will start on 2 week taper.  Again encouraged to continued benadryl and/or pepcid/zantac to help with itching.  Referral to dermatology given or may FU with PCP.  Discussed plan with pt, he agreed.  Return precautions advised.  Repeat VS WNL.  I personally performed the services described in this documentation, which was scribed in my presence. The recorded information has been reviewed and is accurate.  Garlon Hatchet, PA-C 05/15/13 2205

## 2013-05-15 NOTE — ED Notes (Signed)
Pt states he thinks he has "poison oak" rash. Pt states it started to get bad yesterday. Pt has red papular bumps to arms, axilla, neck. Areas are extremely itchy per pt. Pt states now he thinks the rash is spreading to his mouth and hair line. Pt has no acute distress. Pt states he was seen for same yesterday for same and given "steroids".

## 2013-05-17 ENCOUNTER — Emergency Department (HOSPITAL_BASED_OUTPATIENT_CLINIC_OR_DEPARTMENT_OTHER)
Admission: EM | Admit: 2013-05-17 | Discharge: 2013-05-17 | Disposition: A | Discharge status: Home / Self Care | Payer: 59 | Attending: Emergency Medicine | Admitting: Emergency Medicine

## 2013-05-17 ENCOUNTER — Encounter (HOSPITAL_BASED_OUTPATIENT_CLINIC_OR_DEPARTMENT_OTHER): Payer: Self-pay | Admitting: Emergency Medicine

## 2013-05-17 DIAGNOSIS — Z880 Allergy status to penicillin: Secondary | ICD-10-CM | POA: Insufficient documentation

## 2013-05-17 DIAGNOSIS — M129 Arthropathy, unspecified: Secondary | ICD-10-CM | POA: Insufficient documentation

## 2013-05-17 DIAGNOSIS — Z79899 Other long term (current) drug therapy: Secondary | ICD-10-CM | POA: Insufficient documentation

## 2013-05-17 DIAGNOSIS — B019 Varicella without complication: Secondary | ICD-10-CM

## 2013-05-17 DIAGNOSIS — I10 Essential (primary) hypertension: Secondary | ICD-10-CM | POA: Insufficient documentation

## 2013-05-17 DIAGNOSIS — L408 Other psoriasis: Secondary | ICD-10-CM | POA: Insufficient documentation

## 2013-05-17 LAB — COMPREHENSIVE METABOLIC PANEL
ALBUMIN: 3.3 g/dL — AB (ref 3.5–5.2)
ALK PHOS: 62 U/L (ref 39–117)
ALT: 25 U/L (ref 0–53)
AST: 13 U/L (ref 0–37)
BILIRUBIN TOTAL: 0.5 mg/dL (ref 0.3–1.2)
BUN: 14 mg/dL (ref 6–23)
CHLORIDE: 95 meq/L — AB (ref 96–112)
CO2: 25 mEq/L (ref 19–32)
Calcium: 9.1 mg/dL (ref 8.4–10.5)
Creatinine, Ser: 1.2 mg/dL (ref 0.50–1.35)
GFR calc Af Amer: 88 mL/min — ABNORMAL LOW (ref >90)
GFR calc non Af Amer: 76 mL/min — ABNORMAL LOW (ref >90)
Glucose, Bld: 109 mg/dL — ABNORMAL HIGH (ref 70–99)
POTASSIUM: 3.7 meq/L (ref 3.7–5.3)
Sodium: 135 mEq/L — ABNORMAL LOW (ref 137–147)
Total Protein: 6.9 g/dL (ref 6.0–8.3)

## 2013-05-17 LAB — CBC WITH DIFFERENTIAL/PLATELET
BASOS PCT: 0 % (ref 0–1)
Band Neutrophils: 20 % — ABNORMAL HIGH (ref 0–10)
Basophils Absolute: 0 10*3/uL (ref 0.0–0.1)
EOS PCT: 3 % (ref 0–5)
Eosinophils Absolute: 0.4 10*3/uL (ref 0.0–0.7)
HCT: 46.1 % (ref 39.0–52.0)
HEMOGLOBIN: 15.8 g/dL (ref 13.0–17.0)
LYMPHS ABS: 1.5 10*3/uL (ref 0.7–4.0)
Lymphocytes Relative: 11 % — ABNORMAL LOW (ref 12–46)
MCH: 28.2 pg (ref 26.0–34.0)
MCHC: 34.3 g/dL (ref 30.0–36.0)
MCV: 82.3 fL (ref 78.0–100.0)
Monocytes Absolute: 0.7 10*3/uL (ref 0.1–1.0)
Monocytes Relative: 5 % (ref 3–12)
NEUTROS PCT: 61 % (ref 43–77)
Neutro Abs: 11.1 10*3/uL — ABNORMAL HIGH (ref 1.7–7.7)
Platelets: 237 10*3/uL (ref 150–400)
RBC: 5.6 MIL/uL (ref 4.22–5.81)
RDW: 14.4 % (ref 11.5–15.5)
WBC MORPHOLOGY: INCREASED
WBC: 13.7 10*3/uL — ABNORMAL HIGH (ref 4.0–10.5)

## 2013-05-17 MED ORDER — OXYCODONE-ACETAMINOPHEN 5-325 MG PO TABS: 2.0000 | ORAL_TABLET | ORAL | Status: DC | PRN

## 2013-05-17 MED ORDER — CEPHALEXIN 500 MG PO CAPS: 500.0000 mg | ORAL_CAPSULE | Freq: Four times a day (QID) | ORAL | Status: DC

## 2013-05-17 MED ORDER — ACYCLOVIR 400 MG PO TABS: 800.0000 mg | ORAL_TABLET | Freq: Every day | ORAL | Status: DC

## 2013-05-17 MED ORDER — DOXYCYCLINE HYCLATE 100 MG PO CAPS: 100.0000 mg | ORAL_CAPSULE | Freq: Two times a day (BID) | ORAL | Status: DC

## 2013-05-17 NOTE — ED Provider Notes (Signed)
CSN: [id number]     Arrival date & time 05/17/13  1158 History   First MD Initiated Contact with Patient 05/17/13 1214     Chief Complaint  Patient presents with  . Rash     (Consider location/radiation/quality/duration/timing/severity/associated sxs/prior Treatment) Patient is a 38 y.o. male presenting with rash. The history is provided by the patient. No language interpreter was used.  Rash Location:  Full body Quality: itchiness and redness   Severity:  Moderate Onset quality:  Gradual Duration:  5 days Timing:  Constant Chronicity:  New Relieved by:  Nothing Worsened by:  Nothing tried Ineffective treatments:  None tried Associated symptoms: no sore throat and no tongue swelling     Past Medical History  Diagnosis Date  . Fluid retention in legs   . Arthritis   . Psoriasis   . Hypertension    History reviewed. No pertinent past surgical history. Family History  Problem Relation Age of Onset  . Pulmonary fibrosis Father    History  Substance Use Topics  . Smoking status: Never Smoker   . Smokeless tobacco: Not on file  . Alcohol Use: 4.8 oz/week    8 Cans of beer per week    Review of Systems  HENT: Negative for sore throat.   Skin: Positive for rash.  All other systems reviewed and are negative.      Allergies  Penicillins and Dilaudid  Home Medications   Current Outpatient Rx  Name  Route  Sig  Dispense  Refill  . amLODipine (NORVASC) 5 MG tablet   Oral   Take 5 mg by mouth daily.         . folic acid (FOLVITE) 1 MG tablet   Oral   Take 1 mg by mouth daily.         Marland Kitchen. GLUCOSAMINE HCL PO   Oral   Take 2,000 mg by mouth at bedtime as needed (joint pain).         Marland Kitchen. lisinopril-hydrochlorothiazide (PRINZIDE,ZESTORETIC) 20-25 MG per tablet   Oral   Take 1 tablet by mouth daily.         . naproxen sodium (ANAPROX) 220 MG tablet   Oral   Take 660 mg by mouth 2 (two) times daily as needed (pain).         Marland Kitchen. omega-3 acid ethyl esters  (LOVAZA) 1 G capsule   Oral   Take 1 g by mouth 2 (two) times daily.         Marland Kitchen. oxyCODONE-acetaminophen (PERCOCET/ROXICET) 5-325 MG per tablet   Oral   Take 1 tablet by mouth every 4 (four) hours as needed.   15 tablet   0   . predniSONE (DELTASONE) 10 MG tablet      Days 1-2, take 6 tablets Days 3-4, take 5 tablets Days 5-6, take 4 tablets Days 7-8, take 3 tablets Days 9-10, take 2 tablets Days 11-12, take 1 tablet   42 tablet   0    There were no vitals taken for this visit. Physical Exam  Constitutional: He is oriented to person, place, and time. He appears well-developed and well-nourished.  HENT:  Head: Normocephalic.  Right Ear: External ear normal.  Left Ear: External ear normal.  Lesion mouth  Eyes: Conjunctivae are normal. Pupils are equal, round, and reactive to light.  Neck: Normal range of motion.  Cardiovascular: Normal rate and normal heart sounds.   Pulmonary/Chest: Effort normal.  Abdominal: Soft.  Musculoskeletal: Normal range of motion.  Neurological: He is alert and oriented to person, place, and time. He has normal reflexes.  Skin: Rash noted.  multuple blisters, pustules,  Yellow draining crusted areas,    Full body  Psychiatric: He has a normal mood and affect.    ED Course  Procedures (including critical care time) Labs Review Labs Reviewed  CBC WITH DIFFERENTIAL  COMPREHENSIVE METABOLIC PANEL  HIV ANTIBODY (ROUTINE TESTING)  VARICELLA-ZOSTER BY PCR   Imaging Review No results found.   EKG Interpretation None      MDM   Final diagnoses:  Chicken pox    Dr. Judd Lien in to see and examine pt.   Images sent to Dr. Drue Second who advised probable chicken pox.   She advised acyclovir, keflex and doxycycline.  Pt has labs, drawn.  Pt counseled.       Lonia Skinner Laura, PA-C 05/17/13 1538

## 2013-05-17 NOTE — Discharge Instructions (Signed)
Stop taking prednisone  (cortisone/steroids)  Chickenpox in Adults Chickenpox is an illness caused by a virus. This virus can spread easily from one person to another. Those with chickenpox almost never get it more than once. While it usually strikes children, adults who have never had the illness or vaccine can get chickenpox. In children, the illness is reasonably mild, although annoying. In adults, it can be very serious. CAUSES  A varicella-zoster virus causes chickenpox. The virus is passed in tiny droplets that the infected person coughs or sneezes into the air. Chickenpox can also be passed when someone comes into contact with the fluid produced by the chickenpox rash. When someone has been exposed to chickenpox, he or she usually comes down with the illness within about 10 to 21 days.  SYMPTOMS  The illness usually starts with:  Body aches and pain.  Headache.  Irritability.  Tiredness.  Fever.  Sore throat. A day or two later, a rash develops. The rash is made up of very itchy blisters. The rash lasts about 5 to 7 days. Each "chicken pock" heals over with a crusty scab. Chickenpox is very serious illness in adults. There is a higher risk of complications, including:  Pneumonia.  Skin infection.  Bone infection (osteomyelitis).  Joint infection (septic arthritis).  Brain infection (encephalitis).  Toxic shock syndrome.  Bleeding problems.  Problems with balance and muscle control (cerebellar ataxia).  Death. Women who get chickenpox during pregnancy have a higher risk of having a baby with birth defects. DIAGNOSIS  A diagnosis is based on the presence of the usual symptoms of achiness and fever along with the characteristic rash. If there is any question, blood tests can be done to diagnose the infection. TREATMENT  Treatment for chickenpox may include:  Taking the anti-viral drug acyclovir to shorten the length of the illness and to decrease its severity. The  drug has to be started within 24 hours of symptoms.  Taking medicine as directed by your caregiver.  Applying calamine lotion to decrease itchiness.  Baking soda or oatmeal baths to soothe itchy skin. When a person who has never had chickenpox has been exposed to the virus (especially a pregnant woman or a patient with HIV or AIDS) they might benefit from a shot of varicella-zoster immune globulin (VZIG). This helps prevent the person from actually coming down with the illness. VZIG must be given within 72 hours of exposure to the virus. HOME CARE INSTRUCTIONS   Only take over-the-counter or prescriptions medicines for pain, discomfort, or fever as directed by your caregiver.  Try taking a lukewarm (not hot) bath every few hours. Adding several tablespoons of baking soda or oatmeal may help make the bath more soothing.  Ice packs or cold washcloths applied to the rash may help improve itching.  Ask your caregiver if you may use an over-the-counter antihistamine (such as diphenhydramine) to decrease itching.  Wash your hands often. This helps lower the risk of a bacterial skin infection, as well as passing the virus to others. If you can, use alcohol-based rubs or wipes. If you cannot get these, use regular soap and water.  If you have blisters in your mouth, do not eat or drink spicy, salty, or acidic things. Soft, bland, cold foods and beverages will feel best.  Avoid people who have not had chickenpox or women who are pregnant. SEEK IMMEDIATE MEDICAL CARE IF:   You have a hard time breathing.  You have a severe headache.  You have a  stiff neck.  You have severe joint pain or stiffness.  You feel disoriented or confused.  You are having trouble walking or keeping your balance.  You have an oral temperature above 102 F (38.9 C).  The area around one of the chickenpox becomes very red, hot to the touch, painful, or leaks pus. Document Released: 12/04/2007 Document Revised:  05/19/2011 Document Reviewed: 12/04/2007 [facility] Patient Information 2014 [place], [place].

## 2013-05-17 NOTE — ED Notes (Signed)
Pt c/o rash "all over" x 4 days. Pt sts he is taking prednisone and [facility]

## 2013-05-17 NOTE — ED Notes (Signed)
Right Radial Artery x1 attempt for blood work. Verbal order from Langston MaskerKaren Sofia, [place]. Pressure held. No complications noted.

## 2013-05-18 LAB — HIV ANTIBODY (ROUTINE TESTING W REFLEX): HIV: NONREACTIVE

## 2013-05-19 NOTE — ED Provider Notes (Signed)
Medical screening examination/treatment/procedure(s) were conducted as a shared visit with non-physician practitioner(s) and myself.  I personally evaluated the patient during the encounter. Patient is a 38 year old male who presents with rash which has been worsening over the past several days. He has had no relief with medications prescribed. He denies any fevers or chills.  On exam, vitals are stable and the patient is afebrile. Head is atraumatic normocephalic. Neck is supple. Heart is regular rate and rhythm and lungs are clear. Examination of the skin reveals diffuse vesicular lesions of varying ages.  Physical examination in appearance of the lesions is consistent with chickenpox. Pictures were taken and reviewed by Dr. Ilsa IhaSnyder from infectious disease who agrees with this assessment. Patient will be treated with antivirals and Keflex for what appears to be a superinfection. He is to followup with infectious disease and return to the ER for any problems.     Geoffery Lyonsouglas Marcelo Ickes, MD 05/19/13 1626

## 2013-05-20 ENCOUNTER — Emergency Department (HOSPITAL_COMMUNITY): Payer: 59

## 2013-05-20 ENCOUNTER — Encounter (HOSPITAL_COMMUNITY): Payer: Self-pay | Admitting: Emergency Medicine

## 2013-05-20 ENCOUNTER — Telehealth (HOSPITAL_COMMUNITY): Payer: Self-pay

## 2013-05-20 ENCOUNTER — Emergency Department (HOSPITAL_COMMUNITY)
Admission: EM | Admit: 2013-05-20 | Discharge: 2013-05-21 | Disposition: A | Discharge status: Other Acute Inpatient Hospital | Payer: 59 | Attending: Family Medicine | Admitting: Family Medicine

## 2013-05-20 DIAGNOSIS — L0291 Cutaneous abscess, unspecified: Secondary | ICD-10-CM

## 2013-05-20 DIAGNOSIS — A419 Sepsis, unspecified organism: Secondary | ICD-10-CM

## 2013-05-20 DIAGNOSIS — L0390 Cellulitis, unspecified: Secondary | ICD-10-CM

## 2013-05-20 DIAGNOSIS — R21 Rash and other nonspecific skin eruption: Secondary | ICD-10-CM | POA: Insufficient documentation

## 2013-05-20 DIAGNOSIS — I10 Essential (primary) hypertension: Secondary | ICD-10-CM | POA: Insufficient documentation

## 2013-05-20 DIAGNOSIS — L511 Stevens-Johnson syndrome: Secondary | ICD-10-CM

## 2013-05-20 DIAGNOSIS — L4050 Arthropathic psoriasis, unspecified: Secondary | ICD-10-CM | POA: Insufficient documentation

## 2013-05-20 HISTORY — DX: Varicella without complication: B01.9

## 2013-05-20 LAB — COMPREHENSIVE METABOLIC PANEL
ALBUMIN: 2.4 g/dL — AB (ref 3.5–5.2)
ALT: 31 U/L (ref 0–53)
AST: 25 U/L (ref 0–37)
Alkaline Phosphatase: 67 U/L (ref 39–117)
BUN: 33 mg/dL — AB (ref 6–23)
CALCIUM: 9.6 mg/dL (ref 8.4–10.5)
CO2: 26 mEq/L (ref 19–32)
CREATININE: 1.57 mg/dL — AB (ref 0.50–1.35)
Chloride: 90 mEq/L — ABNORMAL LOW (ref 96–112)
GFR calc Af Amer: 64 mL/min — ABNORMAL LOW (ref >90)
GFR, EST NON AFRICAN AMERICAN: 55 mL/min — AB (ref >90)
Glucose, Bld: 99 mg/dL (ref 70–99)
Potassium: 4 mEq/L (ref 3.7–5.3)
Sodium: 131 mEq/L — ABNORMAL LOW (ref 137–147)
Total Bilirubin: 0.4 mg/dL (ref 0.3–1.2)
Total Protein: 5.6 g/dL — ABNORMAL LOW (ref 6.0–8.3)

## 2013-05-20 LAB — URINALYSIS, ROUTINE W REFLEX MICROSCOPIC
GLUCOSE, UA: NEGATIVE mg/dL
Hgb urine dipstick: NEGATIVE
KETONES UR: NEGATIVE mg/dL
LEUKOCYTES UA: NEGATIVE
Nitrite: NEGATIVE
PROTEIN: NEGATIVE mg/dL
Specific Gravity, Urine: 1.028 (ref 1.005–1.030)
Urobilinogen, UA: 0.2 mg/dL (ref 0.0–1.0)
pH: 5.5 (ref 5.0–8.0)

## 2013-05-20 LAB — CBC WITH DIFFERENTIAL/PLATELET
BASOS ABS: 0 10*3/uL (ref 0.0–0.1)
BASOS PCT: 0 % (ref 0–1)
EOS PCT: 10 % — AB (ref 0–5)
Eosinophils Absolute: 1.7 10*3/uL — ABNORMAL HIGH (ref 0.0–0.7)
HEMATOCRIT: 44.4 % (ref 39.0–52.0)
Hemoglobin: 15.6 g/dL (ref 13.0–17.0)
Lymphocytes Relative: 8 % — ABNORMAL LOW (ref 12–46)
Lymphs Abs: 1.4 10*3/uL (ref 0.7–4.0)
MCH: 28.1 pg (ref 26.0–34.0)
MCHC: 35.1 g/dL (ref 30.0–36.0)
MCV: 79.9 fL (ref 78.0–100.0)
MONO ABS: 0.7 10*3/uL (ref 0.1–1.0)
Monocytes Relative: 4 % (ref 3–12)
Neutro Abs: 12.9 10*3/uL — ABNORMAL HIGH (ref 1.7–7.7)
Neutrophils Relative %: 77 % (ref 43–77)
Platelets: 306 10*3/uL (ref 150–400)
RBC: 5.56 MIL/uL (ref 4.22–5.81)
RDW: 14 % (ref 11.5–15.5)
WBC: 16.6 10*3/uL — ABNORMAL HIGH (ref 4.0–10.5)

## 2013-05-20 LAB — PROCALCITONIN: PROCALCITONIN: 0.56 ng/mL

## 2013-05-20 LAB — LACTIC ACID, PLASMA: Lactic Acid, Venous: 2.4 mmol/L — ABNORMAL HIGH (ref 0.5–2.2)

## 2013-05-20 LAB — I-STAT CG4 LACTIC ACID, ED: LACTIC ACID, VENOUS: 3.19 mmol/L — AB (ref 0.5–2.2)

## 2013-05-20 MED ORDER — SODIUM CHLORIDE 0.9 % IV SOLN
1000.0000 mL | Freq: Once | INTRAVENOUS | Status: AC
  Administered 2013-05-20: 1000 mL via INTRAVENOUS

## 2013-05-20 MED ORDER — SODIUM CHLORIDE 0.9 % IV SOLN
1000.0000 mL | INTRAVENOUS | Status: DC
Start: 2013-05-20 — End: 2013-05-21
  Administered 2013-05-20: 1000 mL via INTRAVENOUS

## 2013-05-20 MED ORDER — VANCOMYCIN HCL IN DEXTROSE 1-5 GM/200ML-% IV SOLN
1000.0000 mg | Freq: Once | INTRAVENOUS | Status: AC
  Administered 2013-05-20: 1000 mg via INTRAVENOUS
  Filled 2013-05-20: qty 200

## 2013-05-20 MED ORDER — SODIUM CHLORIDE 0.9 % IV BOLUS (SEPSIS)
1000.0000 mL | Freq: Once | INTRAVENOUS | Status: AC
  Administered 2013-05-20: 1000 mL via INTRAVENOUS

## 2013-05-20 MED ORDER — FENTANYL CITRATE 0.05 MG/ML IJ SOLN
50.0000 ug | Freq: Once | INTRAMUSCULAR | Status: AC
  Administered 2013-05-20: 50 ug via INTRAVENOUS
  Filled 2013-05-20: qty 2

## 2013-05-20 MED ORDER — FENTANYL CITRATE 0.05 MG/ML IJ SOLN
100.0000 ug | Freq: Once | INTRAMUSCULAR | Status: AC
  Administered 2013-05-20: 100 ug via INTRAVENOUS
  Filled 2013-05-20: qty 2

## 2013-05-20 NOTE — ED Notes (Signed)
Spoke with Leotis ShamesLauren, PA about access she states that CCM has been notified and will come down to see pt.

## 2013-05-20 NOTE — ED Provider Notes (Signed)
CSN: [id number]     Arrival date & time 05/20/13  1337 History   None    Chief Complaint  Patient presents with  . Varicella     (Consider location/radiation/quality/duration/timing/severity/associated sxs/prior Treatment) HPI Comments: [name] is a 38 y.o. male with a past medical history of  presenting the Emergency Department with a chief complaint of worsening rash since 05/14/2013.  He reports 1 week ago a pruritic rash to bilateral axilla, originally diagnosed with contact dermatitis. He reports the rash spread from axilla to the "rest of his body". He noted the oral lesions on 05/15/2013. He patient reports d/c his prednisone on 3/10. And has been compliant with Acyclovir, keflex, and doxycycline at home. This is his 4th visit in the ED in the past 7 days.  Reports mild cough today. Denies fever. Denies recent travel. New medication prior to the rash, no new soaps or lotions. The patient reports working outside, everyday. He reports taking Benadryl 50mg  PTA.  He reports generalized abdominal pain without vomiting, diarrhea or constipation.  He reports having chicken pox as a child.  PCP: [facility] in [place].     The history is provided by the patient and medical records. No language interpreter was used.    Past Medical History  Diagnosis Date  . Fluid retention in legs   . Arthritis   . Psoriasis   . Hypertension   . Varicella    No past surgical history on file. Family History  Problem Relation Age of Onset  . Pulmonary fibrosis Father    History  Substance Use Topics  . Smoking status: Never Smoker   . Smokeless tobacco: Not on file  . Alcohol Use: 4.8 oz/week    8 Cans of beer per week    Review of Systems  Constitutional: Negative for fever and chills.  HENT: Positive for mouth sores.   Respiratory: Positive for cough. Negative for wheezing and stridor.   Cardiovascular: Negative for chest pain and palpitations.  Gastrointestinal: Positive for  abdominal pain. Negative for nausea, vomiting and diarrhea.  Skin: Positive for color change and rash.      Allergies  Penicillins and Dilaudid  Home Medications   Current Outpatient Rx  Name  Route  Sig  Dispense  Refill  . acyclovir (ZOVIRAX) 400 MG tablet   Oral   Take 2 tablets (800 mg total) by mouth 5 (five) times daily.   50 tablet   0   . amLODipine (NORVASC) 5 MG tablet   Oral   Take 5 mg by mouth daily.         . cephALEXin (KEFLEX) 500 MG capsule   Oral   Take 1 capsule (500 mg total) by mouth 4 (four) times daily.   40 capsule   0   . doxycycline (VIBRAMYCIN) 100 MG capsule   Oral   Take 1 capsule (100 mg total) by mouth 2 (two) times daily.   20 capsule   0   . folic acid (FOLVITE) 1 MG tablet   Oral   Take 1 mg by mouth daily.         Marland Kitchen GLUCOSAMINE HCL PO   Oral   Take 2,000 mg by mouth at bedtime as needed (joint pain).         Marland Kitchen lisinopril-hydrochlorothiazide (PRINZIDE,ZESTORETIC) 20-25 MG per tablet   Oral   Take 1 tablet by mouth daily.         . naproxen sodium (ANAPROX) 220  MG tablet   Oral   Take 660 mg by mouth 2 (two) times daily as needed (pain).         Marland Kitchen omega-3 acid ethyl esters (LOVAZA) 1 G capsule   Oral   Take 1 g by mouth 2 (two) times daily.         Marland Kitchen oxyCODONE-acetaminophen (PERCOCET/ROXICET) 5-325 MG per tablet   Oral   Take 1 tablet by mouth every 4 (four) hours as needed.   15 tablet   0   . oxyCODONE-acetaminophen (PERCOCET/ROXICET) 5-325 MG per tablet   Oral   Take 2 tablets by mouth every 4 (four) hours as needed for severe pain.   15 tablet   0   . predniSONE (DELTASONE) 10 MG tablet      Days 1-2, take 6 tablets Days 3-4, take 5 tablets Days 5-6, take 4 tablets Days 7-8, take 3 tablets Days 9-10, take 2 tablets Days 11-12, take 1 tablet   42 tablet   0    BP 80/46  Pulse 113  Temp(Src) 98 F (36.7 C)  Resp 17  Ht 6' (1.829 m)  Wt 420 lb (190.511 kg)  BMI 56.95 kg/m2  SpO2  100% Physical Exam  Nursing note and vitals reviewed. Constitutional: He is oriented to person, place, and time. He appears well-developed. No distress.  Exam limited by patient's body habitus.    HENT:  Head: Normocephalic and atraumatic. Head is with right periorbital erythema and with left periorbital erythema.  Mouth/Throat: Oral lesions present.  Erythremic lesions to bilateral buccal mucosa. Multiple white lesions to soft palate with associated surrounding erythema.    Neck: Neck supple.  Cardiovascular: Regular rhythm.  Tachycardia present.   Pulmonary/Chest: Breath sounds normal. No accessory muscle usage. Not tachypneic. No respiratory distress. He has no decreased breath sounds. He has no wheezes. He has no rhonchi. He has no rales. He exhibits no tenderness.  Abdominal: Soft. He exhibits no mass. There is generalized tenderness. There is no rebound and no guarding.  Neurological: He is alert and oriented to person, place, and time.  Skin: Skin is warm and dry. Rash noted. Rash is maculopapular and vesicular. He is not diaphoretic.  Confluent erythemic macular papular rash with overlying vesicles, and associated moderate edema and weeping to upper extremities, perioral area.  Macular papular rash to chest, lower extremities, crosses into hairline.  Vesicles noted to palms and soles of feet.    Psychiatric: He has a normal mood and affect. His speech is normal and behavior is normal.    ED Course  Procedures (including critical care time) Labs Review Labs Reviewed  CULTURE, BLOOD (ROUTINE X 2)  CULTURE, BLOOD (ROUTINE X 2)  URINE CULTURE  CBC WITH DIFFERENTIAL  COMPREHENSIVE METABOLIC PANEL  URINALYSIS, ROUTINE W REFLEX MICROSCOPIC  VARICELLA-ZOSTER BY PCR  VARICELLA ZOSTER ANTIBODY, IGG  VARICELLA ZOSTER ANTIBODY, IGM  I-STAT CG4 LACTIC ACID, ED   Imaging Review Dg Chest Portable 1 View  05/20/2013   CLINICAL DATA:  Varicella  EXAM: PORTABLE CHEST - 1 VIEW  COMPARISON:   December 27, 2010  FINDINGS: Lungs are clear. Heart is upper normal in size with normal pulmonary vascularity. No adenopathy. No bone lesions.  IMPRESSION: No edema or consolidation.   Electronically Signed   By: Bretta Bang M.D.   On: 05/20/2013 15:03     EKG Interpretation None      MDM   Final diagnoses:  Cellulitis  Sepsis   Prior to the  patient's arrival a previous provider, Cheron SchaumannLeslie Sofia, PA-C, called reporting previous presentation of the rash and labs drawn but were lost during processing.  Pt presents with worsening rash for 7 days. Worsening rash with concern for possible disseminated varicella and cellulitis to upper extremities.  Upon arrival the patient was hypotensive at 86/56, Tachycardic 126, afebrile, normal  mentation upon arrival.  Manual BP was 110's/50's per RN.  Sepsis protocol was initiated. The patient was also evaluated by Dr. Freida BusmanAllen who advises consult to Infectious Disease. The patient reports compliance with keflex, and doxycycline and has failed out-pt treatment for cellulitis. 1440 Consult to Dr. Georgette DoverSinder, Infectious Disease who advises, Admit the patient for chicken pox and tachycardia, and the patient will need a negative pressure room.  And she will contact another Infectious Disease specialist to come evaluate the patient. She also sent me several photos (frontal view, left arm, perioral, right axilla) of the patient from 05/17/2013.  Based on these photos, there is worsening rash today and an increase in erythema. RN and phlebotomy unable to draw blood or find IV access. IV team paged. Critical care paged for IV access by Dr. Freida BusmanAllen. Dr. Gwendolyn GrantWalden to attempt US IV. 1730- Pt signed out at shift change to Fayrene HelperBowie Tran PA-C, will follow up on lab results and further management of the patient.  Meds given in ED:  Medications  sodium chloride 0.9 % bolus 1,000 mL (not administered)  vancomycin (VANCOCIN) IVPB 1000 mg/200 mL premix (not administered)    New  Prescriptions   No medications on file        Clabe SealLauren M Aydin Cavalieri, PA-C 05/20/13 2137

## 2013-05-20 NOTE — Consult Note (Signed)
PULMONARY / CRITICAL CARE MEDICINE   Name: [name] MRN: [id number] DOB: Mar 18, 1975    ADMISSION DATE:  05/20/2013 CONSULTATION DATE:  05/20/2013  REFERRING MD :  EDP PRIMARY SERVICE: Diffuse rash  CHIEF COMPLAINT:  Rash  BRIEF PATIENT DESCRIPTION: 38 y.o. M presented with diffuse erythematous rash throughout body with scattered weeping vesicles.  [facility]8 as well as [facility] 3/10 for same problem, has worsened and now all over body.  SIGNIFICANT EVENTS / STUDIES:  3/7 - [facility] with rash in bilateral armpits, given IM Toradol and Solumedrol, told to take Benadryl at home. 3/8 - [facility] rash, given prednisone taper. 3/10 - [facility], told had chicken pox, started on acyclovir, keflex, doxy. 3/13 - [facility] with worsened rash, all over body, erythematous patches throughout body with scattered vesicles, some of which are weeping.  LINES / TUBES:  CULTURES: VZV PCR 3/10 ??? (can not find results of it in system) HIV Ab 3/10 >>> neg  ANTIBIOTICS: Doxy 3/10 >>> Keflex 3/10 >>> Acyclovir 3/10 >>>  HISTORY OF PRESENT ILLNESS:  [name] is a 38 y.o. M with PMH of Fluid retention, Arthritis, Psoriasis, HTN,who presents to the Emergency Department 3/13 for a worsening rash which he first noticed on 3/7.  At that time, [facility] and was treated for contact dermatitis.  THe following day he returned [facility] for worsened rash, pred taper added.  2 days later, [facility].  There he was told rash may be chickenpox and was treated with acyclovir, keflex, doxycyline.  Today, pt states rash spread all over body and is much worse than it was.  Rash is diffuse, erythematous patches with scattered vesicles some of which are weeping.  Pt states he has never had anything like this before.  No known exposures to new clothing, chemicals, pets, other household items.  No known history of bed bugs, fleas, tic exposure.   No recent travel.  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 Rash is mildly painful, "feels annoying" and itching.  He denies chest pain, SOB, abdominal pain, N/V/D, fevers/chills/sweats.  He is unsure if he has had any oral lesions but denies any trouble eating/drinking fluids.  PAST MEDICAL HISTORY :  Past Medical History  Diagnosis Date  . Fluid retention in legs   . Arthritis   . Psoriasis   . Hypertension   . Varicella    No past surgical history on file. Prior to Admission medications   Medication Sig Start Date End Date Taking? Authorizing Provider  acyclovir (ZOVIRAX) 400 MG tablet Take 2 tablets (800 mg total) by mouth 5 (five) times daily. 05/17/13   Elson AreasLeslie K Sofia, PA-C  amLODipine (NORVASC) 5 MG tablet Take 5 mg by mouth daily.    Historical Provider, MD  cephALEXin (KEFLEX) 500 MG capsule Take 1 capsule (500 mg total) by mouth 4 (four) times daily. 05/17/13   Elson AreasLeslie K Sofia, PA-C  doxycycline (VIBRAMYCIN) 100 MG capsule Take 1 capsule (100 mg total) by mouth 2 (two) times daily. 05/17/13   Elson AreasLeslie K Sofia, PA-C  folic acid (FOLVITE) 1 MG tablet Take 1 mg by mouth daily.    Historical Provider, MD  GLUCOSAMINE HCL PO Take 2,000 mg by  mouth at bedtime as needed (joint pain).    Historical Provider, MD  lisinopril-hydrochlorothiazide (PRINZIDE,ZESTORETIC) 20-25 MG per tablet Take 1 tablet by mouth daily.    Historical Provider, MD  naproxen sodium (ANAPROX) 220 MG tablet Take 660 mg by mouth 2 (two) times daily as needed (pain).    Historical Provider, MD  omega-3 acid ethyl esters (LOVAZA) 1 G capsule Take 1 g by mouth 2 (two) times daily.    Historical Provider, MD  oxyCODONE-acetaminophen (PERCOCET/ROXICET) 5-325 MG per tablet Take 1 tablet by mouth every 4 (four) hours as needed. 05/14/13   Garlon Hatchet, PA-C  oxyCODONE-acetaminophen (PERCOCET/ROXICET) 5-325 MG per tablet Take 2 tablets by mouth  every 4 (four) hours as needed for severe pain. 05/17/13   Elson Areas, PA-C  predniSONE (DELTASONE) 10 MG tablet Days 1-2, take 6 tablets Days 3-4, take 5 tablets Days 5-6, take 4 tablets Days 7-8, take 3 tablets Days 9-10, take 2 tablets Days 11-12, take 1 tablet 05/15/13   Garlon Hatchet, PA-C   Allergies  Allergen Reactions  . Penicillins Hives  . Dilaudid [Hydromorphone Hcl] Itching    FAMILY HISTORY:  Family History  Problem Relation Age of Onset  . Pulmonary fibrosis Father    SOCIAL HISTORY:  reports that he has never smoked. He does not have any smokeless tobacco history on file. He reports that he drinks about 4.8 ounces of alcohol per week. He reports that he does not use illicit drugs.  REVIEW OF SYSTEMS:  Negative except as stated in HPI.  SUBJECTIVE: No throat swelling or difficulty breathing.  No chest pain.  Rash is mildly itchy and mildly painful.  VITAL SIGNS: Temp:  [98 F (36.7 C)] 98 F (36.7 C) (03/13 1352) Pulse Rate:  [113-123] 118 (03/13 1530) Resp:  [18-22] 20 (03/13 1530) BP: (80-89)/(42-50) 80/46 mmHg (03/13 1533) SpO2:  [97 %-100 %] 100 % (03/13 1530) Weight:  [420 lb (190.511 kg)] 420 lb (190.511 kg) (03/13 1352) HEMODYNAMICS:   VENTILATOR SETTINGS:   INTAKE / OUTPUT: Intake/Output   None     PHYSICAL EXAMINATION: General: Pleasant obese male, resting in bed, in NAD. Neuro: A&O x 3, non-focal.  HEENT: [place]/AT. PERRL, sclerae anicteric. Oral mucosa dry, ulcerations noted on sides of tongue. Cardiovascular: RRR, no M/R/G.  Lungs: Respirations even and unlabored.  CTA bilaterally, No W/R/R.  Abdomen: BS x 4, soft, NT/ND.  Musculoskeletal: No gross deformities, mild edema. Skin: Diffuse rash throughout body.  Erythematous, patches with scattered vesicles, some weeping clear discharge.    LABS:  CBC  Recent Labs Lab 05/17/13 1417  WBC 13.7*  HGB 15.8  HCT 46.1  PLT 237   Coag's No results found for this basename: APTT, INR,   in the last 168 hours BMET  Recent Labs Lab 05/17/13 1417  NA 135*  K 3.7  CL 95*  CO2 25  BUN 14  CREATININE 1.20  GLUCOSE 109*   Electrolytes  Recent Labs Lab 05/17/13 1417  CALCIUM 9.1   Sepsis Markers No results found for this basename: LATICACIDVEN, PROCALCITON, O2SATVEN,  in the last 168 hours ABG No results found for this basename: PHART, PCO2ART, PO2ART,  in the last 168 hours Liver Enzymes  Recent Labs Lab 05/17/13 1417  AST 13  ALT 25  ALKPHOS 62  BILITOT 0.5  ALBUMIN 3.3*   Cardiac Enzymes No results found for this basename: TROPONINI, PROBNP,  in the last 168 hours Glucose No results found for this basename:  GLUCAP,  in the last 168 hours  Imaging Dg Chest Portable 1 View  05/20/2013   CLINICAL DATA:  Varicella  EXAM: PORTABLE CHEST - 1 VIEW  COMPARISON:  December 27, 2010  FINDINGS: Lungs are clear. Heart is upper normal in size with normal pulmonary vascularity. No adenopathy. No bone lesions.  IMPRESSION: No edema or consolidation.   Electronically Signed   By: Bretta Bang M.D.   On: 05/20/2013 15:03      ASSESSMENT / PLAN:  DERM A: Diffuse Rash  - etiology unclear at this time.  Question infectious vs viral vs drug eruption vs others.  Concern for Sepsis  P: - Place CVL. - PCT algorithm. - Trend lactate. - ID consult. - Abx per ID. - IVF. - Cuff BP is inaccurate, would only be concern if mental status begins to deteriorate. - Monitor WBC's/fever curve.  Rutherford Guys, PA - [facility] [place] Pgr: [phone]  or [phone]    I have personally obtained a history, examined the patient, evaluated laboratory and imaging results, formulated the assessment and plan and placed orders. CRITICAL CARE: The patient is critically ill with multiple organ systems failure and requires high complexity decision making for assessment and support, frequent evaluation and titration of therapies, application of  advanced monitoring technologies and extensive interpretation of multiple databases. Critical Care Time devoted to patient care services described in this note is ___ minutes.    Pulmonary and Critical Care Medicine [facility] Pager: [phone]  05/20/2013, 3:54 PM

## 2013-05-20 NOTE — ED Notes (Signed)
Family Medicine at bedside, states that they will transfer pt to [facility] due to skin integrity and risk for infection.

## 2013-05-20 NOTE — ED Notes (Signed)
[facility] and dx with Chicken Pox. Was told to come back because the medications he was taking has not been helping so he needed to get IV dose medications.

## 2013-05-20 NOTE — ED Notes (Signed)
Carlena SaxBlair, EDP at bedside to attempt US IV. States there is too much cellulitis for an IV. Critical care also at bedside and made aware. Critical care states they will consul Dr. Molli KnockYacoub.

## 2013-05-20 NOTE — Procedures (Signed)
Central Venous Catheter Insertion Procedure Note Arlester MarkerRicky L Stepp [id number] [birth year]  Procedure: Insertion of Central Venous Catheter Indications: Assessment of intravascular volume, Drug and/or fluid administration and Frequent blood sampling  Procedure Details Consent: Risks of procedure as well as the alternatives and risks of each were explained to the (patient/caregiver).  Consent for procedure obtained. Time Out: Verified patient identification, verified procedure, site/side was marked, verified correct patient position, special equipment/implants available, medications/allergies/relevent history reviewed, required imaging and test results available.  Performed  Maximum sterile technique was used including antiseptics, cap, gloves, gown, hand hygiene, mask and sheet. Skin prep: Chlorhexidine; local anesthetic administered A antimicrobial bonded/coated triple lumen catheter was placed in the left subclavian vein using the Seldinger technique.  Evaluation Blood flow good Complications: No apparent complications Patient did tolerate procedure well. Chest X-ray ordered to verify placement.  CXR: pending.  U/S used in placement.  Picture in chart.  Fenix Ruppe 05/20/2013, 4:09 PM

## 2013-05-20 NOTE — ED Notes (Signed)
IV team returned page, will come and attempt blood work and access

## 2013-05-20 NOTE — ED Provider Notes (Addendum)
Patient here with worsening rash, concern for cellulitis. Broad spectrum antibiotics started by Mellody DrownLauren Parker. I called for admission for patient after Critical Care placed L subclavian line. I verified placement and allowed nursing to begin using for fluids.  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 [facility] - Dr. Louanne BeltonFiles given report and accepted patient in transfer.   Dagmar HaitWilliam Rayyan Burley, MD 05/20/13 1833  Dagmar HaitWilliam Ramonda Galyon, MD 05/20/13 [phone]

## 2013-05-20 NOTE — ED Notes (Signed)
X-ray called to verify placement of access line

## 2013-05-20 NOTE — ED Notes (Signed)
Phlebotomy at bedside attempting blood cultures and lab work, unsuccessful. RN also unsuccessful, IV team paged.

## 2013-05-20 NOTE — ED Notes (Signed)
[facility] PALS line called for update on pt room, PALS representative states that pt is still waiting on a room. Pt updated.

## 2013-05-20 NOTE — ED Provider Notes (Signed)
Received patient care from previous provider. Patient presents with worsening rash concerning for disseminated varicella infection. He has been evaluated by critical care who felt patient does not need critical care admission. Will consult medicine for admission once IV is established and labs has been drawn. Patient requests pain medication, we'll provide pain medication for comfort otherwise patient is currently resting comfortably in bed, in no acute respiratory distress.  5:47 PM Pt has been admitted to medicine for further care.  Currently hemodynamically stable.  Will continue with abx and IVF.    BP 110/58  Pulse 112  Temp(Src) 98 F (36.7 C)  Resp 16  Ht 6' (1.829 m)  Wt 420 lb (190.511 kg)  BMI 56.95 kg/m2  SpO2 97%  I have reviewed nursing notes and vital signs. I personally reviewed the imaging tests through PACS system  I reviewed available ER/hospitalization records thought the EMR  Results for orders placed during the hospital encounter of 05/20/13  CBC WITH DIFFERENTIAL      Result Value Ref Range   WBC 16.6 (*) 4.0 - 10.5 K/uL   RBC 5.56  4.22 - 5.81 MIL/uL   Hemoglobin 15.6  13.0 - 17.0 g/dL   HCT 16.1  09.6 - 04.5 %   MCV 79.9  78.0 - 100.0 fL   MCH 28.1  26.0 - 34.0 pg   MCHC 35.1  30.0 - 36.0 g/dL   RDW 40.9  81.1 - 91.4 %   Platelets 306  150 - 400 K/uL   Neutrophils Relative % 77  43 - 77 %   Neutro Abs 12.9 (*) 1.7 - 7.7 K/uL   Lymphocytes Relative 8 (*) 12 - 46 %   Lymphs Abs 1.4  0.7 - 4.0 K/uL   Monocytes Relative 4  3 - 12 %   Monocytes Absolute 0.7  0.1 - 1.0 K/uL   Eosinophils Relative 10 (*) 0 - 5 %   Eosinophils Absolute 1.7 (*) 0.0 - 0.7 K/uL   Basophils Relative 0  0 - 1 %   Basophils Absolute 0.0  0.0 - 0.1 K/uL  COMPREHENSIVE METABOLIC PANEL      Result Value Ref Range   Sodium 131 (*) 137 - 147 mEq/L   Potassium 4.0  3.7 - 5.3 mEq/L   Chloride 90 (*) 96 - 112 mEq/L   CO2 26  19 - 32 mEq/L   Glucose, Bld 99  70 - 99 mg/dL   BUN 33 (*)  6 - 23 mg/dL   Creatinine, Ser 7.82 (*) 0.50 - 1.35 mg/dL   Calcium 9.6  8.4 - 95.6 mg/dL   Total Protein 5.6 (*) 6.0 - 8.3 g/dL   Albumin 2.4 (*) 3.5 - 5.2 g/dL   AST 25  0 - 37 U/L   ALT 31  0 - 53 U/L   Alkaline Phosphatase 67  39 - 117 U/L   Total Bilirubin 0.4  0.3 - 1.2 mg/dL   GFR calc non Af Amer 55 (*) >90 mL/min   GFR calc Af Amer 64 (*) >90 mL/min  LACTIC ACID, PLASMA      Result Value Ref Range   Lactic Acid, Venous 2.4 (*) 0.5 - 2.2 mmol/L  I-STAT CG4 LACTIC ACID, ED      Result Value Ref Range   Lactic Acid, Venous 3.19 (*) 0.5 - 2.2 mmol/L   Dg Chest Portable 1 View  05/20/2013   CLINICAL DATA:  Varicella, history of hypertension.  EXAM: PORTABLE CHEST - 1  VIEW  COMPARISON:  DG CHEST 1V PORT dated 05/20/2013  FINDINGS: The lungs are well-expanded. There is no alveolar or interstitial infiltrate. The cardiac silhouette is top-normal in size. The pulmonary vascularity is not engorged. A left subclavian venous catheter tip lies in the region of the midportion of the SVC. There is no pleural effusion. The observed portions of the bony thorax appear normal.  IMPRESSION: There is no evidence of pneumonia nor other acute cardiopulmonary abnormality.   Electronically Signed   By: David  SwazilandJordan   On: 05/20/2013 17:04   Dg Chest Portable 1 View  05/20/2013   CLINICAL DATA:  Varicella  EXAM: PORTABLE CHEST - 1 VIEW  COMPARISON:  December 27, 2010  FINDINGS: Lungs are clear. Heart is upper normal in size with normal pulmonary vascularity. No adenopathy. No bone lesions.  IMPRESSION: No edema or consolidation.   Electronically Signed   By: Bretta BangWilliam  Woodruff M.D.   On: 05/20/2013 15:03      Fayrene HelperBowie Kimari Lienhard, PA-C 05/20/13 1747

## 2013-05-20 NOTE — ED Notes (Signed)
Gwendolyn GrantWalden notified RN that [facility]Files [facility] will be accepting patient, [facility] line called and PALS representative states that pt is still awaiting bed. Pt notified.

## 2013-05-20 NOTE — Consult Note (Addendum)
    [facility]     Reason for Consult: rash    Referring Physician: Dr. Gwendolyn GrantWalden  Active Problems:   Cellulitis   Sepsis      Recommendations: Can use keflex for superficial infection I recommend evaluation at an academic center with burn unit facilities   Assessment: He has a diffuse rash with mucous membrane involvement, scaley, head to toe.  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    HIV negative.    Antibiotics: Has been on doxycycline  HPI: [name] is a 38 y.o. male with a history of psoriasis, psoriatic arthritis by his report who has presented to the ED for his fourth visit for a rash.  It started about 6 days ago with progressive worsening involvement, including mucous membrane in mouth (erythema).  Rash is mildly itchy, some pustules.  No fever, no chills.     Review of Systems: A comprehensive review of systems was negative.  Past Medical History  Diagnosis Date  . Fluid retention in legs   . Arthritis   . Psoriasis   . Hypertension   . Varicella     History  Substance Use Topics  . Smoking status: Never Smoker   . Smokeless tobacco: Not on file  . Alcohol Use: 4.8 oz/week    8 Cans of beer per week    Family History  Problem Relation Age of Onset  . Pulmonary fibrosis Father    Allergies  Allergen Reactions  . Penicillins Hives  . Dilaudid [Hydromorphone Hcl] Itching    OBJECTIVE: Blood pressure 110/50, pulse 122, temperature 98 F (36.7 C), resp. rate 16, height 6' (1.829 m), weight 420 lb (190.511 kg), SpO2 96.00%. General: awake, alert, nad Skin:  Diffuse macular, papular rash with plaque areas, bullae, mm involement; no warmth, no tenderness Lungs: CTA B Cor: RRR Abdomen: obese, nt, nd Ext: + edema in all 4 extremities  Microbiology: No results found for this or any previous visit (from the past 240 hour(s)).  Staci RighterOMER, Haidee Stogsdill, MD [facility] [email or web address] [phone] pager  [phone] cell 05/20/2013, 6:46 PM

## 2013-05-20 NOTE — ED Notes (Signed)
IV team nurse states she was unable to see anything to attempt to draw blood, recommended EJ or IJ for access. PA Lauren and Dr. Freida BusmanAllen, EDP notified.

## 2013-05-20 NOTE — ED Notes (Signed)
Dr. Molli KnockYacoub at the bedside attempting central line

## 2013-05-20 NOTE — Consult Note (Signed)
Family Medicine Teaching Service Consult History and Physical Service Pager: [phone]  Patient name: [name] Medical record number: [id number] Date of birth: 02-16-1976 Age: 38 y.o. Gender: male  Primary Care Provider: Default, Provider, MD Consultants: ID Code Status: full  Chief Complaint: rash  Assessment and Plan: [name] is a 38 y.o. male presenting with rash. PMH is significant for psoriasis, HTN, arthritis.  # Rash/Sepsis: Unclear etiology, could be varicella or steven's johnson with superimposed impetigo and/or cellulitis. Varicella more unlikely given mucosal involvement and lack of response to acyclovir. Patient septic on admission - ID consulted in ED, unlikely to be infectious  - Lactic acid 3.19 and procalcitonin 0.56, WBC 16.1 - hypotensive and tachycardic with some improvement after fluids - s/p vancomycin, fentanyl and 4L NS in ED - transfer to [facility] for dermatological consult and possible need for burn center  Disposition: ED to ED transfer  History of Present Illness: [name] is a 38 y.o. male presenting with severe rash starting last Saturday. Pt reports that prior to Saturday his skin was completely normal. He developed a red itchy rash in his axillae on Saturday which quickly spread to all surfaces of his body. In each area it began as discrete a discrete red spots which coalesced primarily on his torso and proximal extremities to form contiguous erythema. The rash is severely itch and he has been scratching it. The rash has spread to involve his scalp, groin area, palms, soles of feet and inside his mouth. He has had several visits to our ED and has been treated with solumedrol, acyclovir, doxycycline, and keflex, he denies improvement or relief with any of these. He has also tried benadryl which did not help. There are no alleviating or exacerbating factors and the itching has been constant for the last 6 days. He has occupational exposure to salt melt  which is not new. He denies travel, change in soaps/detergents/other chemical exposures. He had no new medications prior to the rash starting but many new meds since then (see above). He denies fever.  Review Of Systems: Per HPI  Otherwise 12 point review of systems was performed and was unremarkable.  Patient Active Problem List   Diagnosis Date Noted  . Cellulitis 05/20/2013  . Sepsis 05/20/2013   Past Medical History: Past Medical History  Diagnosis Date  . Fluid retention in legs   . Arthritis   . Psoriasis   . Hypertension   . Varicella    Past Surgical History: No past surgical history on file. Social History: History  Substance Use Topics  . Smoking status: Never Smoker   . Smokeless tobacco: Not on file  . Alcohol Use: 4.8 oz/week    8 Cans of beer per week   Please also refer to relevant sections of EMR.  Family History: Family History  Problem Relation Age of Onset  . Pulmonary fibrosis Father    Allergies and Medications: Allergies  Allergen Reactions  . Penicillins Hives  . Dilaudid [Hydromorphone Hcl] Itching   No current facility-administered medications on file prior to encounter.   Current Outpatient Prescriptions on File Prior to Encounter  Medication Sig Dispense Refill  . acyclovir (ZOVIRAX) 400 MG tablet Take 2 tablets (800 mg total) by mouth 5 (five) times daily.  50 tablet  0  . amLODipine (NORVASC) 5 MG tablet Take 5 mg by mouth daily.      . cephALEXin (KEFLEX) 500 MG capsule Take 1 capsule (500 mg total) by mouth  4 (four) times daily.  40 capsule  0  . doxycycline (VIBRAMYCIN) 100 MG capsule Take 1 capsule (100 mg total) by mouth 2 (two) times daily.  20 capsule  0  . folic acid (FOLVITE) 1 MG tablet Take 1 mg by mouth daily.      Marland Kitchen GLUCOSAMINE HCL PO Take 2,000 mg by mouth at bedtime as needed (joint pain).      Marland Kitchen lisinopril-hydrochlorothiazide (PRINZIDE,ZESTORETIC) 20-25 MG per tablet Take 1 tablet by mouth daily.      . naproxen sodium  (ANAPROX) 220 MG tablet Take 660 mg by mouth 2 (two) times daily as needed (pain).      Marland Kitchen omega-3 acid ethyl esters (LOVAZA) 1 G capsule Take 1 g by mouth 2 (two) times daily.      Marland Kitchen oxyCODONE-acetaminophen (PERCOCET/ROXICET) 5-325 MG per tablet Take 1 tablet by mouth every 4 (four) hours as needed.  15 tablet  0  . oxyCODONE-acetaminophen (PERCOCET/ROXICET) 5-325 MG per tablet Take 2 tablets by mouth every 4 (four) hours as needed for severe pain.  15 tablet  0  . predniSONE (DELTASONE) 10 MG tablet Days 1-2, take 6 tablets Days 3-4, take 5 tablets Days 5-6, take 4 tablets Days 7-8, take 3 tablets Days 9-10, take 2 tablets Days 11-12, take 1 tablet  42 tablet  0    Objective: BP 110/50  Pulse 122  Temp(Src) 98 F (36.7 C)  Resp 16  Ht 6' (1.829 m)  Wt 420 lb (190.511 kg)  BMI 56.95 kg/m2  SpO2 96% Exam: General: obese male, lying in bed, covered in red rash, very uncomfortable-appearing HEENT: NCAT, MMM, perioral crusting and buccal erythema, oropharynx clear Cardiovascular: RRR, no M/R/G Respiratory: CTAB, normal WOB Abdomen: obese, NTND Skin: erythematous maculopapular rash which is confluent on the torso and proximal extremities, superimposed crusting in the perioral and periumbilical areas. Dorsal hands with large purple pappules with the appearance of dried blood under the surface, purple/red macules on the palms and soles, multiple weeping and/or bleeding excoriations, worst on distal arms Neuro: alert, oriented, no focal deficits   Labs and Imaging: CBC BMET   Recent Labs Lab 05/20/13 1623  WBC 16.6*  HGB 15.6  HCT 44.4  PLT 306    Recent Labs Lab 05/20/13 1623  NA 131*  K 4.0  CL 90*  CO2 26  BUN 33*  CREATININE 1.57*  GLUCOSE 99  CALCIUM 9.6    Lactic acid 3.19 Procalcitonin 0.56  CXR: The lungs are well-expanded. There is no alveolar or interstitial infiltrate. The cardiac silhouette is top-normal in size. The pulmonary vascularity is not engorged.  A left subclavian venous catheter tip lies in the region of the midportion of the SVC. There is no pleural effusion. The observed portions of the bony thorax appear normal.  Beverely Low, MD 05/20/2013, 6:20 PM PGY-1, [facility] Family Medicine FPTS Intern pager: [phone], text pages welcome  I have seen and evaluated the above patient.  Addendum in blue.  Everlene Other DO Family Medicine PGY-2

## 2013-05-21 LAB — VARICELLA ZOSTER ANTIBODY, IGG: Varicella IgG: 1012 Index — ABNORMAL HIGH (ref <135.00)

## 2013-05-21 MED ORDER — MORPHINE SULFATE 4 MG/ML IJ SOLN
4.0000 mg | Freq: Once | INTRAMUSCULAR | Status: AC
  Administered 2013-05-21: 4 mg via INTRAVENOUS
  Filled 2013-05-21: qty 1

## 2013-05-21 MED ORDER — MORPHINE SULFATE 2 MG/ML IJ SOLN
2.0000 mg | Freq: Once | INTRAMUSCULAR | Status: AC
  Administered 2013-05-21: 2 mg via INTRAVENOUS
  Filled 2013-05-21: qty 1

## 2013-05-21 NOTE — ED Notes (Signed)
MD at bedside. 

## 2013-05-21 NOTE — ED Notes (Signed)
Spoke with Dr. Everlene OtherJayce Cook (FP) regarding pt and notified him of no ICU beds available at [facility] for pt at this time and that pt is still waiting in ED.

## 2013-05-21 NOTE — ED Notes (Signed)
Spoke with [facility] line. They do not anticipate an ICU bed being available for pt tonight.  They will call if anything changes.  Spoke with Hassie BruceKim, AC and Dr. Ranae PalmsYelverton, EDP- [facility] ? [facility].

## 2013-05-21 NOTE — ED Notes (Signed)
Dr. Tommy RainwaterJ. Cook (FP) in ED to assess pt.  [facility].

## 2013-05-21 NOTE — Consult Note (Signed)
FMTS Attending Note: Noah LevySara Delancey MD [phone] pager office [phone] I  have  reviewed this patient's presentation with Dr Adriana Simasook at time of their visit to ED.. I agree with the resident's findings, assessment and care plan. 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

## 2013-05-22 LAB — URINE CULTURE
Colony Count: NO GROWTH
Culture: NO GROWTH

## 2013-05-23 LAB — VARICELLA ZOSTER ANTIBODY, IGM: Varicella-Zoster Ab, IgM: 0.04 {ISR} (ref <0.91)

## 2013-05-23 NOTE — ED Provider Notes (Signed)
Medical screening examination/treatment/procedure(s) were conducted as a shared visit with non-physician practitioner(s) and myself.  I personally evaluated the patient during the encounter.   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

## 2013-05-23 NOTE — Consult Note (Addendum)
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  Doubtful an infection.  No need for PCCM to admit for now.  Recommend transfer to a center with a burn unit.  PCCM will sign off.  Please call back if needed.  Patient seen and examined, agree with above note.  I dictated the care and orders written for this patient under my direction.  Alyson ReedyWesam G Yacoub, MD [phone]

## 2013-05-24 DIAGNOSIS — L519 Erythema multiforme, unspecified: Secondary | ICD-10-CM | POA: Insufficient documentation

## 2013-05-25 NOTE — Progress Notes (Signed)
UR completed 

## 2013-05-26 LAB — CULTURE, BLOOD (ROUTINE X 2)

## 2013-05-26 LAB — VARICELLA-ZOSTER BY PCR: Varicella-Zoster, PCR: NOT DETECTED

## 2013-06-06 ENCOUNTER — Other Ambulatory Visit: Payer: Self-pay | Admitting: Family Medicine

## 2013-12-08 ENCOUNTER — Encounter: Payer: Self-pay | Admitting: Family Medicine

## 2013-12-08 DIAGNOSIS — E668 Other obesity: Secondary | ICD-10-CM | POA: Insufficient documentation

## 2015-01-03 ENCOUNTER — Other Ambulatory Visit: Payer: Self-pay

## 2022-06-20 ENCOUNTER — Inpatient Hospital Stay (HOSPITAL_COMMUNITY)
Admission: EM | Admit: 2022-06-20 | Discharge: 2022-06-26 | DRG: 194 | Disposition: A | Discharge status: Home / Self Care | Payer: Medicaid Other | Attending: Internal Medicine | Admitting: Internal Medicine

## 2022-06-20 ENCOUNTER — Emergency Department (HOSPITAL_COMMUNITY): Payer: Medicaid Other

## 2022-06-20 ENCOUNTER — Encounter (HOSPITAL_COMMUNITY): Payer: Self-pay

## 2022-06-20 ENCOUNTER — Other Ambulatory Visit: Payer: Self-pay

## 2022-06-20 DIAGNOSIS — R519 Headache, unspecified: Secondary | ICD-10-CM | POA: Diagnosis present

## 2022-06-20 DIAGNOSIS — I10 Essential (primary) hypertension: Secondary | ICD-10-CM | POA: Diagnosis present

## 2022-06-20 DIAGNOSIS — Z79899 Other long term (current) drug therapy: Secondary | ICD-10-CM

## 2022-06-20 DIAGNOSIS — L409 Psoriasis, unspecified: Secondary | ICD-10-CM | POA: Diagnosis present

## 2022-06-20 DIAGNOSIS — E661 Drug-induced obesity: Secondary | ICD-10-CM | POA: Diagnosis present

## 2022-06-20 DIAGNOSIS — Z885 Allergy status to narcotic agent status: Secondary | ICD-10-CM

## 2022-06-20 DIAGNOSIS — J9611 Chronic respiratory failure with hypoxia: Secondary | ICD-10-CM | POA: Diagnosis present

## 2022-06-20 DIAGNOSIS — E871 Hypo-osmolality and hyponatremia: Secondary | ICD-10-CM | POA: Diagnosis present

## 2022-06-20 DIAGNOSIS — Z880 Allergy status to penicillin: Secondary | ICD-10-CM

## 2022-06-20 DIAGNOSIS — R5381 Other malaise: Secondary | ICD-10-CM | POA: Diagnosis present

## 2022-06-20 DIAGNOSIS — Z91148 Patient's other noncompliance with medication regimen for other reason: Secondary | ICD-10-CM

## 2022-06-20 DIAGNOSIS — Z1152 Encounter for screening for COVID-19: Secondary | ICD-10-CM

## 2022-06-20 DIAGNOSIS — E669 Obesity, unspecified: Secondary | ICD-10-CM | POA: Diagnosis present

## 2022-06-20 DIAGNOSIS — J189 Pneumonia, unspecified organism: Principal | ICD-10-CM | POA: Diagnosis present

## 2022-06-20 DIAGNOSIS — J320 Chronic maxillary sinusitis: Secondary | ICD-10-CM | POA: Diagnosis present

## 2022-06-20 DIAGNOSIS — E662 Morbid (severe) obesity with alveolar hypoventilation: Secondary | ICD-10-CM | POA: Diagnosis present

## 2022-06-20 DIAGNOSIS — I160 Hypertensive urgency: Secondary | ICD-10-CM | POA: Diagnosis present

## 2022-06-20 DIAGNOSIS — Z6841 Body Mass Index (BMI) 40.0 and over, adult: Secondary | ICD-10-CM

## 2022-06-20 DIAGNOSIS — Z9981 Dependence on supplemental oxygen: Secondary | ICD-10-CM

## 2022-06-20 LAB — CBC WITH DIFFERENTIAL/PLATELET
Abs Immature Granulocytes: 0.04 10*3/uL (ref 0.00–0.07)
Basophils Absolute: 0 10*3/uL (ref 0.0–0.1)
Basophils Relative: 1 %
Eosinophils Absolute: 0.2 10*3/uL (ref 0.0–0.5)
Eosinophils Relative: 3 %
HCT: 54 % — ABNORMAL HIGH (ref 39.0–52.0)
Hemoglobin: 18.1 g/dL — ABNORMAL HIGH (ref 13.0–17.0)
Immature Granulocytes: 1 %
Lymphocytes Relative: 11 %
Lymphs Abs: 0.9 10*3/uL (ref 0.7–4.0)
MCH: 29 pg (ref 26.0–34.0)
MCHC: 33.5 g/dL (ref 30.0–36.0)
MCV: 86.4 fL (ref 80.0–100.0)
Monocytes Absolute: 0.6 10*3/uL (ref 0.1–1.0)
Monocytes Relative: 8 %
Neutro Abs: 6 10*3/uL (ref 1.7–7.7)
Neutrophils Relative %: 76 %
Platelets: 182 10*3/uL (ref 150–400)
RBC: 6.25 MIL/uL — ABNORMAL HIGH (ref 4.22–5.81)
RDW: 13.4 % (ref 11.5–15.5)
WBC: 7.7 10*3/uL (ref 4.0–10.5)
nRBC: 0 % (ref 0.0–0.2)

## 2022-06-20 LAB — BASIC METABOLIC PANEL
Anion gap: 9 (ref 5–15)
BUN: 8 mg/dL (ref 6–20)
CO2: 23 mmol/L (ref 22–32)
Calcium: 9 mg/dL (ref 8.9–10.3)
Chloride: 99 mmol/L (ref 98–111)
Creatinine, Ser: 0.91 mg/dL (ref 0.61–1.24)
GFR, Estimated: >60 mL/min (ref >60)
Glucose, Bld: 122 mg/dL — ABNORMAL HIGH (ref 70–99)
Potassium: 4.1 mmol/L (ref 3.5–5.1)
Sodium: 131 mmol/L — ABNORMAL LOW (ref 135–145)

## 2022-06-20 LAB — RESP PANEL BY RT-PCR (RSV, FLU A&B, COVID)  RVPGX2
Influenza A by PCR: NEGATIVE
Influenza B by PCR: NEGATIVE
Resp Syncytial Virus by PCR: NEGATIVE
SARS Coronavirus 2 by RT PCR: NEGATIVE

## 2022-06-20 LAB — BRAIN NATRIURETIC PEPTIDE: B Natriuretic Peptide: 85.9 pg/mL (ref 0.0–100.0)

## 2022-06-20 LAB — TROPONIN I (HIGH SENSITIVITY): Troponin I (High Sensitivity): 14 ng/L (ref <18)

## 2022-06-20 MED ORDER — SODIUM CHLORIDE 0.9 % IV SOLN
2.0000 g | Freq: Once | INTRAVENOUS | Status: AC
  Administered 2022-06-21: 2 g via INTRAVENOUS
  Filled 2022-06-20: qty 20

## 2022-06-20 MED ORDER — FUROSEMIDE 10 MG/ML IJ SOLN
20.0000 mg | Freq: Once | INTRAMUSCULAR | Status: AC
  Administered 2022-06-21: 20 mg via INTRAVENOUS
  Filled 2022-06-20: qty 4

## 2022-06-20 MED ORDER — AZITHROMYCIN 250 MG PO TABS
500.0000 mg | ORAL_TABLET | Freq: Once | ORAL | Status: AC
  Administered 2022-06-21: 500 mg via ORAL
  Filled 2022-06-20: qty 2

## 2022-06-20 MED ORDER — ACETAMINOPHEN 500 MG PO TABS
1000.0000 mg | ORAL_TABLET | Freq: Once | ORAL | Status: AC
  Administered 2022-06-21: 1000 mg via ORAL
  Filled 2022-06-20: qty 2

## 2022-06-20 MED ORDER — LISINOPRIL 10 MG PO TABS
20.0000 mg | ORAL_TABLET | Freq: Once | ORAL | Status: AC
  Administered 2022-06-21: 20 mg via ORAL
  Filled 2022-06-20: qty 2

## 2022-06-20 MED ORDER — METOCLOPRAMIDE HCL 5 MG/ML IJ SOLN
10.0000 mg | Freq: Once | INTRAMUSCULAR | Status: AC
  Administered 2022-06-21: 10 mg via INTRAVENOUS
  Filled 2022-06-20: qty 2

## 2022-06-20 MED ORDER — AMLODIPINE BESYLATE 5 MG PO TABS: 5.0000 mg | ORAL_TABLET | Freq: Once | ORAL | Status: DC

## 2022-06-20 NOTE — ED Provider Notes (Signed)
[place] EMERGENCY DEPARTMENT AT [facility] Provider Note   CSN: [id number] Arrival date & time: 06/20/22  1827     History {Add pertinent medical, surgical, social history, OB history to HPI:1} Chief Complaint  Patient presents with   Cough   Generalized Body Aches   Fever    [name] is a 47 y.o. male.  The history is provided by the patient and medical records.  Cough Associated symptoms: fever   Fever Associated symptoms: cough   [name] is a 47 y.o. male who presents to the Emergency Department complaining of *** HA, body aches, cough, sob. Nonproductive.  Sxs started Tuesday  HA for one month. Responds to tylenol.  Has chest pain - central pressure, constant.    Missed all meds for two days  HTN, hypothyroid. On chronic oxygen 2-3L.      Home Medications Prior to Admission medications   Medication Sig Start Date End Date Taking? Authorizing Provider  acyclovir (ZOVIRAX) 400 MG tablet Take 2 tablets (800 mg total) by mouth 5 (five) times daily. 05/17/13   Elson Areas, PA-C  amLODipine (NORVASC) 5 MG tablet Take 5 mg by mouth daily.    [provider]  cephALEXin (KEFLEX) 500 MG capsule Take 1 capsule (500 mg total) by mouth 4 (four) times daily. 05/17/13   Elson Areas, PA-C  doxycycline (VIBRAMYCIN) 100 MG capsule Take 1 capsule (100 mg total) by mouth 2 (two) times daily. 05/17/13   Elson Areas, PA-C  folic acid (FOLVITE) 1 MG tablet Take 1 mg by mouth daily.    [provider]  GLUCOSAMINE HCL PO Take 2,000 mg by mouth at bedtime as needed (joint pain).    [provider]  lisinopril-hydrochlorothiazide (PRINZIDE,ZESTORETIC) 20-25 MG per tablet Take 1 tablet by mouth daily.    [provider]  naproxen sodium (ANAPROX) 220 MG tablet Take 660 mg by mouth 2 (two) times daily as needed (pain).    [provider]  omega-3 acid ethyl esters (LOVAZA) 1 G capsule Take 1 g by mouth 2 (two) times  daily.    [provider]  oxyCODONE-acetaminophen (PERCOCET/ROXICET) 5-325 MG per tablet Take 1 tablet by mouth every 4 (four) hours as needed. 05/14/13   Garlon Hatchet, PA-C  oxyCODONE-acetaminophen (PERCOCET/ROXICET) 5-325 MG per tablet Take 2 tablets by mouth every 4 (four) hours as needed for severe pain. 05/17/13   Elson Areas, PA-C  predniSONE (DELTASONE) 10 MG tablet Days 1-2, take 6 tablets Days 3-4, take 5 tablets Days 5-6, take 4 tablets Days 7-8, take 3 tablets Days 9-10, take 2 tablets Days 11-12, take 1 tablet 05/15/13   Garlon Hatchet, PA-C      Allergies    Penicillins and Dilaudid [hydromorphone hcl]    Review of Systems   Review of Systems  Constitutional:  Positive for fever.  Respiratory:  Positive for cough.   All other systems reviewed and are negative.   Physical Exam Updated Vital Signs BP (!) 222/121   Pulse (!) 108   Temp 100.1 F (37.8 C) (Oral)   Resp 20   Ht  (1.753 m)   Wt (!) 244.9 kg   SpO2 91%   BMI 79.74 kg/m  Physical Exam Vitals and nursing note reviewed.  Constitutional:      Appearance: He is well-developed.  HENT:     Head: Normocephalic and atraumatic.  Cardiovascular:     Rate and Rhythm:  Regular rhythm. Tachycardia present.     Heart sounds: No murmur heard. Pulmonary:     Effort: Pulmonary effort is normal. No respiratory distress.     Comments: Rhonchi bilaterally right greater than left Abdominal:     Palpations: Abdomen is soft.     Tenderness: There is no abdominal tenderness. There is no guarding or rebound.  Musculoskeletal:        General: No tenderness.     Comments: Nonpitting edema to BLE. 2+ DP pulses.   Skin:    General: Skin is warm and dry.  Neurological:     Mental Status: He is alert and oriented to person, place, and time.  Psychiatric:        Behavior: Behavior normal.     ED Results / Procedures / Treatments   Labs (all labs ordered are listed, but only abnormal results are  displayed) Labs Reviewed  CBC WITH DIFFERENTIAL/PLATELET - Abnormal; Notable for the following components:      Result Value   RBC 6.25 (*)    Hemoglobin 18.1 (*)    HCT 54.0 (*)    All other components within normal limits  BASIC METABOLIC PANEL - Abnormal; Notable for the following components:   Sodium 131 (*)    Glucose, Bld 122 (*)    All other components within normal limits  RESP PANEL BY RT-PCR (RSV, FLU A&B, COVID)  RVPGX2  BRAIN NATRIURETIC PEPTIDE  TROPONIN I (HIGH SENSITIVITY)  TROPONIN I (HIGH SENSITIVITY)    EKG None  Radiology DG Chest Port 1 View  Result Date: 06/20/2022 CLINICAL DATA:  Cough EXAM: PORTABLE CHEST 1 VIEW COMPARISON:  05/20/2013 FINDINGS: Right lower lobe opacity, suspicious for pneumonia. Mild left suprahilar opacity suggests possible multifocal pneumonia. No pleural effusion or pneumothorax. The heart is top-normal in size. No pleural effusion or pneumothorax. IMPRESSION: Suspected multifocal pneumonia, right lower lobe predominant. Electronically Signed   By: Charline Bills M.D.   On: 06/20/2022 20:10    Procedures Procedures  {Document cardiac monitor, telemetry assessment procedure when appropriate:1}  Medications Ordered in ED Medications - No data to display  ED Course/ Medical Decision Making/ A&P   {   Click here for ABCD2, HEART and other calculatorsREFRESH Note before signing :1}                          Medical Decision Making  ***  {Document critical care time when appropriate:1} {Document review of labs and clinical decision tools ie heart score, Chads2Vasc2 etc:1}  {Document your independent review of radiology images, and any outside records:1} {Document your discussion with family members, caretakers, and with consultants:1} {Document social determinants of health affecting pt's care:1} {Document your decision making why or why not admission, treatments were needed:1} Final Clinical Impression(s) / ED Diagnoses Final  diagnoses:  None    Rx / DC Orders ED Discharge Orders     None

## 2022-06-20 NOTE — ED Triage Notes (Signed)
Cough, congestion, generalized body aches, headache since Tuesday. Daughter diagnosed with pneumonia last week. Last dose of tylenol at 1500

## 2022-06-20 NOTE — ED Provider Triage Note (Signed)
Emergency Medicine Provider Triage Evaluation Note  [name] , a 47 y.o. male  was evaluated in triage.  Pt complains of cough, CP, sob. ON chronic oxygen. Hx of CHF. Sick family members.  Review of Systems  Positive: Cough, CP, SOB Negative:   Physical Exam  BP (!) 222/121   Pulse (!) 108   Temp 100.1 F (37.8 C) (Oral)   Resp 20   Ht 5\' 9"  (1.753 m)   Wt (!) 244.9 kg   SpO2 91%   BMI 79.74 kg/m  Gen:   Awake, no distress   Resp:  Normal effort  MSK:   Moves extremities without difficulty, LE edema Other:    Medical Decision Making  Medically screening exam initiated at 7:20 PM.  Appropriate orders placed.  ABE REINE was informed that the remainder of the evaluation will be completed by another provider, this initial triage assessment does not replace that evaluation, and the importance of remaining in the ED until their evaluation is complete.  CP, cough  BP systolic >220. Needs room in back   Tabbatha Bordelon A, PA-C 06/20/22 1922

## 2022-06-21 DIAGNOSIS — E871 Hypo-osmolality and hyponatremia: Secondary | ICD-10-CM | POA: Diagnosis present

## 2022-06-21 DIAGNOSIS — I160 Hypertensive urgency: Secondary | ICD-10-CM | POA: Diagnosis present

## 2022-06-21 DIAGNOSIS — Z91148 Patient's other noncompliance with medication regimen for other reason: Secondary | ICD-10-CM | POA: Diagnosis not present

## 2022-06-21 DIAGNOSIS — J9611 Chronic respiratory failure with hypoxia: Secondary | ICD-10-CM | POA: Diagnosis present

## 2022-06-21 DIAGNOSIS — Z9981 Dependence on supplemental oxygen: Secondary | ICD-10-CM | POA: Diagnosis not present

## 2022-06-21 DIAGNOSIS — E661 Drug-induced obesity: Secondary | ICD-10-CM | POA: Diagnosis present

## 2022-06-21 DIAGNOSIS — Z880 Allergy status to penicillin: Secondary | ICD-10-CM | POA: Diagnosis not present

## 2022-06-21 DIAGNOSIS — J189 Pneumonia, unspecified organism: Secondary | ICD-10-CM

## 2022-06-21 DIAGNOSIS — Z6841 Body Mass Index (BMI) 40.0 and over, adult: Secondary | ICD-10-CM | POA: Diagnosis not present

## 2022-06-21 DIAGNOSIS — E662 Morbid (severe) obesity with alveolar hypoventilation: Secondary | ICD-10-CM | POA: Diagnosis present

## 2022-06-21 DIAGNOSIS — E669 Obesity, unspecified: Secondary | ICD-10-CM | POA: Diagnosis present

## 2022-06-21 DIAGNOSIS — R519 Headache, unspecified: Secondary | ICD-10-CM | POA: Diagnosis present

## 2022-06-21 DIAGNOSIS — I10 Essential (primary) hypertension: Secondary | ICD-10-CM | POA: Diagnosis present

## 2022-06-21 DIAGNOSIS — R5381 Other malaise: Secondary | ICD-10-CM | POA: Diagnosis present

## 2022-06-21 DIAGNOSIS — Z885 Allergy status to narcotic agent status: Secondary | ICD-10-CM | POA: Diagnosis not present

## 2022-06-21 DIAGNOSIS — L409 Psoriasis, unspecified: Secondary | ICD-10-CM | POA: Diagnosis present

## 2022-06-21 DIAGNOSIS — Z79899 Other long term (current) drug therapy: Secondary | ICD-10-CM | POA: Diagnosis not present

## 2022-06-21 DIAGNOSIS — J320 Chronic maxillary sinusitis: Secondary | ICD-10-CM | POA: Diagnosis present

## 2022-06-21 DIAGNOSIS — Z1152 Encounter for screening for COVID-19: Secondary | ICD-10-CM | POA: Diagnosis not present

## 2022-06-21 LAB — TROPONIN I (HIGH SENSITIVITY): Troponin I (High Sensitivity): 16 ng/L (ref <18)

## 2022-06-21 LAB — CULTURE, BLOOD (ROUTINE X 2)

## 2022-06-21 LAB — LACTIC ACID, PLASMA: Lactic Acid, Venous: 0.9 mmol/L (ref 0.5–1.9)

## 2022-06-21 MED ORDER — SODIUM CHLORIDE 0.9 % IV SOLN
2.0000 g | INTRAVENOUS | Status: AC
  Administered 2022-06-21 – 2022-06-25 (×5): 2 g via INTRAVENOUS
  Filled 2022-06-21 (×5): qty 20

## 2022-06-21 MED ORDER — AZITHROMYCIN 250 MG PO TABS
500.0000 mg | ORAL_TABLET | Freq: Every day | ORAL | Status: AC
  Administered 2022-06-22 – 2022-06-25 (×4): 500 mg via ORAL
  Filled 2022-06-21 (×4): qty 2

## 2022-06-21 MED ORDER — OXYCODONE HCL 5 MG PO TABS
5.0000 mg | ORAL_TABLET | Freq: Once | ORAL | Status: AC
  Administered 2022-06-21: 5 mg via ORAL
  Filled 2022-06-21: qty 1

## 2022-06-21 MED ORDER — ACETAMINOPHEN 325 MG PO TABS
650.0000 mg | ORAL_TABLET | Freq: Once | ORAL | Status: AC
  Administered 2022-06-21: 650 mg via ORAL
  Filled 2022-06-21: qty 2

## 2022-06-21 MED ORDER — DOCUSATE SODIUM 100 MG PO CAPS
100.0000 mg | ORAL_CAPSULE | Freq: Two times a day (BID) | ORAL | Status: DC
  Filled 2022-06-21 (×3): qty 1

## 2022-06-21 MED ORDER — OXYCODONE-ACETAMINOPHEN 5-325 MG PO TABS
1.0000 | ORAL_TABLET | ORAL | Status: DC | PRN
  Administered 2022-06-21 – 2022-06-24 (×9): 1 via ORAL
  Filled 2022-06-21 (×9): qty 1

## 2022-06-21 MED ORDER — HYDRALAZINE HCL 20 MG/ML IJ SOLN: 10.0000 mg | INTRAMUSCULAR | Status: DC | PRN

## 2022-06-21 MED ORDER — LISINOPRIL 20 MG PO TABS
20.0000 mg | ORAL_TABLET | Freq: Every day | ORAL | Status: DC
  Administered 2022-06-21 – 2022-06-26 (×6): 20 mg via ORAL
  Filled 2022-06-21 (×6): qty 1

## 2022-06-21 MED ORDER — ACETAMINOPHEN 325 MG PO TABS
650.0000 mg | ORAL_TABLET | Freq: Four times a day (QID) | ORAL | Status: DC | PRN
  Administered 2022-06-21 – 2022-06-25 (×6): 650 mg via ORAL
  Filled 2022-06-21 (×6): qty 2

## 2022-06-21 MED ORDER — ENOXAPARIN SODIUM 100 MG/ML IJ SOSY
100.0000 mg | PREFILLED_SYRINGE | Freq: Every day | INTRAMUSCULAR | Status: DC
  Administered 2022-06-21 – 2022-06-25 (×5): 100 mg via SUBCUTANEOUS
  Filled 2022-06-21 (×5): qty 1

## 2022-06-21 MED ORDER — POLYETHYLENE GLYCOL 3350 17 G PO PACK: 17.0000 g | PACK | Freq: Every day | ORAL | Status: DC | PRN

## 2022-06-21 MED ORDER — TRAZODONE HCL 50 MG PO TABS: 25.0000 mg | ORAL_TABLET | Freq: Every evening | ORAL | Status: DC | PRN

## 2022-06-21 MED ORDER — HYDROCHLOROTHIAZIDE 25 MG PO TABS
25.0000 mg | ORAL_TABLET | Freq: Every day | ORAL | Status: DC
  Administered 2022-06-21 – 2022-06-26 (×6): 25 mg via ORAL
  Filled 2022-06-21 (×6): qty 1

## 2022-06-21 MED ORDER — AMLODIPINE BESYLATE 5 MG PO TABS
10.0000 mg | ORAL_TABLET | Freq: Every day | ORAL | Status: DC
  Administered 2022-06-21 – 2022-06-26 (×6): 10 mg via ORAL
  Filled 2022-06-21 (×6): qty 2

## 2022-06-21 MED ORDER — ONDANSETRON HCL 4 MG PO TABS: 4.0000 mg | ORAL_TABLET | Freq: Four times a day (QID) | ORAL | Status: DC | PRN

## 2022-06-21 MED ORDER — ACETAMINOPHEN 650 MG RE SUPP: 650.0000 mg | Freq: Four times a day (QID) | RECTAL | Status: DC | PRN

## 2022-06-21 MED ORDER — ONDANSETRON HCL 4 MG/2ML IJ SOLN
4.0000 mg | Freq: Four times a day (QID) | INTRAMUSCULAR | Status: DC | PRN
  Administered 2022-06-22 (×2): 4 mg via INTRAVENOUS
  Filled 2022-06-21: qty 2

## 2022-06-21 MED ORDER — LISINOPRIL-HYDROCHLOROTHIAZIDE 20-25 MG PO TABS: 1.0000 | ORAL_TABLET | Freq: Every day | ORAL | Status: DC

## 2022-06-21 NOTE — ED Notes (Signed)
ED TO INPATIENT HANDOFF REPORT  ED Nurse Name and Phone #:  Kelle Ruppert [phone]  S Name/Age/Gender [name] 47 y.o. male Room/Bed: WA17/WA17  Code Status   Code Status: Full Code  Home/SNF/Other Home Patient oriented to: self, place, time, and situation Is this baseline? Yes   Triage Complete: Triage complete  Chief Complaint Multifocal pneumonia [J18.9]  Triage Note Cough, congestion, generalized body aches, headache since Tuesday. Daughter diagnosed with pneumonia last week. Last dose of tylenol at 1500   Allergies Allergies  Allergen Reactions   Penicillins Hives   Dilaudid [Hydromorphone Hcl] Itching    Level of Care/Admitting Diagnosis ED Disposition     ED Disposition  Admit   Condition  --   Comment  [facility] [[id number]]  Level of Care: Telemetry [5]  Admit to tele based on following criteria: Monitor for Ischemic changes  May admit patient to [facility] or [facility] if equivalent level of care is available:: Yes  Covid Evaluation: Confirmed COVID Negative  Diagnosis: Multifocal pneumonia [9811914]  Admitting Physician: Maryln Gottron [[id number]]  Attending Physician: Kirby Crigler, MIR Jaxson.Roy [[id number]]  Certification:: I certify this patient will need inpatient services for at least 2 midnights  Estimated Length of Stay: 3          B Medical/Surgery History Past Medical History:  Diagnosis Date   Arthritis    Fluid retention in legs    Hypertension    Psoriasis    Varicella    History reviewed. No pertinent surgical history.   A IV Location/Drains/Wounds Patient Lines/Drains/Airways Status     Active Line/Drains/Airways     Name Placement date Placement time Site Days   Peripheral IV 06/21/22 20 G Left;Posterior Hand 06/21/22  0003  Hand  less than 1   CVC Triple Lumen 05/20/13 Left Subclavian 05/20/13  1605  -- 3319            Intake/Output Last 24 hours  Intake/Output Summary (Last 24 hours)  at 06/21/2022 1206 Last data filed at 06/21/2022 0057 Gross per 24 hour  Intake 100 ml  Output --  Net 100 ml    Labs/Imaging Results for orders placed or performed during the hospital encounter of 06/20/22 (from the past 48 hour(s))  Resp panel by RT-PCR (RSV, Flu A&B, Covid) Anterior Nasal Swab     Status: None   Collection Time: 06/20/22  7:10 PM   Specimen: Anterior Nasal Swab  Result Value Ref Range   SARS Coronavirus 2 by RT PCR NEGATIVE NEGATIVE    Comment: (NOTE) SARS-CoV-2 target nucleic acids are NOT DETECTED.  The SARS-CoV-2 RNA is generally detectable in upper respiratory specimens during the acute phase of infection. The lowest concentration of SARS-CoV-2 viral copies this assay can detect is 138 copies/mL. A negative result does not preclude SARS-Cov-2 infection and should not be used as the sole basis for treatment or other patient management decisions. A negative result may occur with  improper specimen collection/handling, submission of specimen other than nasopharyngeal swab, presence of viral mutation(s) within the areas targeted by this assay, and inadequate number of viral copies(<138 copies/mL). A negative result must be combined with clinical observations, patient history, and epidemiological information. The expected result is Negative.  Fact Sheet for Patients:  BloggerCourse.com  Fact Sheet for Healthcare Providers:  SeriousBroker.it  This test is no t yet approved or cleared by the [place] FDA and  has been authorized for detection and/or diagnosis of SARS-CoV-2 by  FDA under an Emergency Use Authorization (EUA). This EUA will remain  in effect (meaning this test can be used) for the duration of the COVID-19 declaration under Section 564(b)(1) of the Act, 21 U.S.C.section 360bbb-3(b)(1), unless the authorization is terminated  or revoked sooner.       Influenza A by PCR NEGATIVE NEGATIVE    Influenza B by PCR NEGATIVE NEGATIVE    Comment: (NOTE) The Xpert Xpress SARS-CoV-2/FLU/RSV plus assay is intended as an aid in the diagnosis of influenza from Nasopharyngeal swab specimens and should not be used as a sole basis for treatment. Nasal washings and aspirates are unacceptable for Xpert Xpress SARS-CoV-2/FLU/RSV testing.  Fact Sheet for Patients: BloggerCourse.com  Fact Sheet for Healthcare Providers: SeriousBroker.it  This test is not yet approved or cleared by the [place] FDA and has been authorized for detection and/or diagnosis of SARS-CoV-2 by FDA under an Emergency Use Authorization (EUA). This EUA will remain in effect (meaning this test can be used) for the duration of the COVID-19 declaration under Section 564(b)(1) of the Act, 21 U.S.C. section 360bbb-3(b)(1), unless the authorization is terminated or revoked.     Resp Syncytial Virus by PCR NEGATIVE NEGATIVE    Comment: (NOTE) Fact Sheet for Patients: BloggerCourse.com  Fact Sheet for Healthcare Providers: SeriousBroker.it  This test is not yet approved or cleared by the [place] FDA and has been authorized for detection and/or diagnosis of SARS-CoV-2 by FDA under an Emergency Use Authorization (EUA). This EUA will remain in effect (meaning this test can be used) for the duration of the COVID-19 declaration under Section 564(b)(1) of the Act, 21 U.S.C. section 360bbb-3(b)(1), unless the authorization is terminated or revoked.  Performed at [facility], [address] [place], [place] [address]   CBC with Differential     Status: Abnormal   Collection Time: 06/20/22  7:58 PM  Result Value Ref Range   WBC 7.7 4.0 - 10.5 K/uL   RBC 6.25 (H) 4.22 - 5.81 MIL/uL   Hemoglobin 18.1 (H) 13.0 - 17.0 g/dL   HCT 60.4 (H) 54.0 - 98.1 %   MCV 86.4 80.0 - 100.0 fL   MCH 29.0 26.0 -  34.0 pg   MCHC 33.5 30.0 - 36.0 g/dL   RDW 19.1 47.8 - 29.5 %   Platelets 182 150 - 400 K/uL   nRBC 0.0 0.0 - 0.2 %   Neutrophils Relative % 76 %   Neutro Abs 6.0 1.7 - 7.7 K/uL   Lymphocytes Relative 11 %   Lymphs Abs 0.9 0.7 - 4.0 K/uL   Monocytes Relative 8 %   Monocytes Absolute 0.6 0.1 - 1.0 K/uL   Eosinophils Relative 3 %   Eosinophils Absolute 0.2 0.0 - 0.5 K/uL   Basophils Relative 1 %   Basophils Absolute 0.0 0.0 - 0.1 K/uL   Immature Granulocytes 1 %   Abs Immature Granulocytes 0.04 0.00 - 0.07 K/uL    Comment: Performed at [facility], [address], [place], [place] [address]  Basic metabolic panel     Status: Abnormal   Collection Time: 06/20/22  7:58 PM  Result Value Ref Range   Sodium 131 (L) 135 - 145 mmol/L   Potassium 4.1 3.5 - 5.1 mmol/L   Chloride 99 98 - 111 mmol/L   CO2 23 22 - 32 mmol/L   Glucose, Bld 122 (H) 70 - 99 mg/dL    Comment: Glucose reference range applies only to samples taken after  fasting for at least 8 hours.   BUN 8 6 - 20 mg/dL   Creatinine, Ser 8.65 0.61 - 1.24 mg/dL   Calcium 9.0 8.9 - 78.4 mg/dL   GFR, Estimated >69 >62 mL/min    Comment: (NOTE) Calculated using the CKD-EPI Creatinine Equation (2021)    Anion gap 9 5 - 15    Comment: Performed at [facility], [address] [place], [place] [address]  Troponin I (High Sensitivity)     Status: None   Collection Time: 06/20/22  7:58 PM  Result Value Ref Range   Troponin I (High Sensitivity) 14 <18 ng/L    Comment: (NOTE) Elevated high sensitivity troponin I (hsTnI) values and significant  changes across serial measurements may suggest ACS but many other  chronic and acute conditions are known to elevate hsTnI results.  Refer to the "Links" section for chest pain algorithms and additional  guidance. Performed at [facility], [address], [place], [place] [address]   Brain natriuretic peptide     Status: None    Collection Time: 06/20/22  7:59 PM  Result Value Ref Range   B Natriuretic Peptide 85.9 0.0 - 100.0 pg/mL    Comment: Performed at [facility], [address] [place], [place] [address]  Troponin I (High Sensitivity)     Status: None   Collection Time: 06/21/22 12:14 AM  Result Value Ref Range   Troponin I (High Sensitivity) 16 <18 ng/L    Comment: (NOTE) Elevated high sensitivity troponin I (hsTnI) values and significant  changes across serial measurements may suggest ACS but many other  chronic and acute conditions are known to elevate hsTnI results.  Refer to the "Links" section for chest pain algorithms and additional  guidance. Performed at [facility], [address] [place], [place] [address]   Culture, blood (routine x 2)     Status: None (Preliminary result)   Collection Time: 06/21/22  1:50 AM   Specimen: BLOOD LEFT HAND  Result Value Ref Range   Specimen Description      BLOOD LEFT HAND Performed at [facility], [address], [place], [place] [address]    Special Requests      BOTTLES DRAWN AEROBIC AND ANAEROBIC Blood Culture adequate volume Performed at [facility], [address] [place], [place] [address]    Culture      NO GROWTH < 12 HOURS Performed at [facility], [address], [place], [place] [address]    Report Status PENDING   Lactic acid, plasma     Status: None   Collection Time: 06/21/22  1:50 AM  Result Value Ref Range   Lactic Acid, Venous 0.9 0.5 - 1.9 mmol/L    Comment: Performed at [facility], [address], [place], [place] [address]  Culture, blood (routine x 2)     Status: None (Preliminary result)   Collection Time: 06/21/22  2:01 AM   Specimen: BLOOD RIGHT HAND  Result Value Ref Range   Specimen Description      BLOOD RIGHT HAND Performed at [facility], [address] [place], [place] [address]    Special Requests       BOTTLES DRAWN AEROBIC AND ANAEROBIC Blood Culture adequate volume Performed at [facility], [address] [place], [place] [address]    Culture      NO GROWTH < 12 HOURS Performed  at [facility], [address], [place], [place] [address]    Report Status PENDING    DG Chest Port 1 View  Result Date: 06/20/2022 CLINICAL DATA:  Cough EXAM: PORTABLE CHEST 1 VIEW COMPARISON:  05/20/2013 FINDINGS: Right lower lobe opacity, suspicious for pneumonia. Mild left suprahilar opacity suggests possible multifocal pneumonia. No pleural effusion or pneumothorax. The heart is top-normal in size. No pleural effusion or pneumothorax. IMPRESSION: Suspected multifocal pneumonia, right lower lobe predominant. Electronically Signed   By: Charline Bills M.D.   On: 06/20/2022 20:10    Pending Labs Unresulted Labs (From admission, onward)     Start     Ordered   06/28/22 0500  Creatinine, serum  (enoxaparin (LOVENOX)    CrCl >/= 30 ml/min)  Weekly,   R     Comments: while on enoxaparin therapy    06/21/22 0804   06/22/22 0500  Basic metabolic panel  Tomorrow morning,   R        06/21/22 0804   06/22/22 0500  CBC  Tomorrow morning,   R        06/21/22 0804   06/22/22 0500  Lipid panel  Tomorrow morning,   R        06/21/22 0804   06/22/22 0500  Hemoglobin A1c  Tomorrow morning,   R        06/21/22 0804   06/21/22 0808  HIV Antibody (routine testing w rflx)  (HIV Antibody (Routine testing w reflex) panel)  Add-on,   AD        06/21/22 0807            Vitals/Pain Today's Vitals   06/21/22 1100 06/21/22 1103 06/21/22 1130 06/21/22 1200  BP: (!) 180/93  (!) 172/94 (!) 158/86  Pulse: 89  86 85  Resp: (!) 29  (!) 24 (!) 25  Temp:  98.3 F (36.8 C)    TempSrc:      SpO2: 94%  92% 92%  Weight:      Height:      PainSc:        Isolation Precautions No active isolations  Medications Medications  oxyCODONE-acetaminophen (PERCOCET/ROXICET) 5-325 MG per tablet 1  tablet (has no administration in time range)  amLODipine (NORVASC) tablet 10 mg (has no administration in time range)  lisinopril-hydrochlorothiazide (ZESTORETIC) 20-25 MG per tablet 1 tablet (has no administration in time range)  enoxaparin (LOVENOX) injection 40 mg (has no administration in time range)  acetaminophen (TYLENOL) tablet 650 mg (has no administration in time range)    Or  acetaminophen (TYLENOL) suppository 650 mg (has no administration in time range)  traZODone (DESYREL) tablet 25 mg (has no administration in time range)  docusate sodium (COLACE) capsule 100 mg (has no administration in time range)  polyethylene glycol (MIRALAX / GLYCOLAX) packet 17 g (has no administration in time range)  ondansetron (ZOFRAN) tablet 4 mg (has no administration in time range)    Or  ondansetron (ZOFRAN) injection 4 mg (has no administration in time range)  hydrALAZINE (APRESOLINE) injection 10 mg (has no administration in time range)  azithromycin (ZITHROMAX) tablet 500 mg (has no administration in time range)  cefTRIAXone (ROCEPHIN) 2 g in sodium chloride 0.9 % 100 mL IVPB (has no administration in time range)  lisinopril (ZESTRIL) tablet 20 mg (20 mg Oral Given 06/21/22 0006)  cefTRIAXone (ROCEPHIN) 2 g in sodium chloride 0.9 % 100 mL IVPB (0 g Intravenous Stopped 06/21/22 0057)  azithromycin (ZITHROMAX) tablet  500 mg (500 mg Oral Given 06/21/22 0002)  acetaminophen (TYLENOL) tablet 1,000 mg (1,000 mg Oral Given 06/21/22 0002)  furosemide (LASIX) injection 20 mg (20 mg Intravenous Given 06/21/22 0007)  metoCLOPramide (REGLAN) injection 10 mg (10 mg Intravenous Given 06/21/22 0006)  oxyCODONE (Oxy IR/ROXICODONE) immediate release tablet 5 mg (5 mg Oral Given 06/21/22 0257)  acetaminophen (TYLENOL) tablet 650 mg (650 mg Oral Given 06/21/22 0733)    Mobility non-ambulatory        R Recommendations: See Admitting Provider Note  Report given to:   Additional Notes:   independent with  feeding, urinal with 1 assist.

## 2022-06-21 NOTE — ED Notes (Addendum)
Administration of AM meds delayed due to med rec incompletion. Will admin when medication is verified.

## 2022-06-21 NOTE — H&P (Signed)
History and Physical  [name] FGH:[id number] DOB: 1976/02/09 DOA: 06/20/2022  PCP: Default, Provider, MD   Chief Complaint: Cough, body aches  HPI: [name] is a 47 y.o. male with medical history significant for hypertension, superobesity, psoriasis being admitted to the hospital with community-acquired multifocal pneumonia and hypertensive urgency.  Patient states he has been having intermittent headache, body aches, cough for the last 5 to 6 days.  Cough is productive of green sputum, he chronically wears 2 to 3 L nasal cannula oxygen and NIV at night.  He has not had to increase this.  He is supposed to be taking medications for hypertension, but admits to taking the medication only once in a while, probably once or twice a month when he thinks of it.  He last took his antihypertensive medication a couple of days ago.  Yesterday before coming to the hospital, he reports having some central chest pain/pressure, but it is now resolved.  Currently he just has a slight headache.  He denies fevers, but did feel hot.  Denies chills, sweats, abdominal pain, nausea, vomiting, diarrhea/constipation, or dysuria.  ED Course: Upon arrival and evaluation in the emergency department, vitals showed low-grade temperature 100.1, pulse 108, respirations 20, blood pressure 222/121, saturating 91% on his baseline 2 L of oxygen.  He was administered amlodipine 5 mg, and lisinopril 20 mg.  Chest x-ray shows evidence of community-acquired pneumonia, so he was given a dose of IV azithromycin and a dose of IV Rocephin in the emergency department.  Blood cultures were obtained, and he was also given 20 mg of IV Lasix.  Review of Systems: Please see HPI for pertinent positives and negatives. A complete 10 system review of systems are otherwise negative.  Past Medical History:  Diagnosis Date   Arthritis    Fluid retention in legs    Hypertension    Psoriasis    Varicella    History reviewed. No pertinent  surgical history.  Social History:  reports that he has never smoked. He does not have any smokeless tobacco history on file. He reports current alcohol use of about 8.0 standard drinks of alcohol per week. He reports that he does not use drugs.   Allergies  Allergen Reactions   Penicillins Hives   Dilaudid [Hydromorphone Hcl] Itching    Family History  Problem Relation Age of Onset   Pulmonary fibrosis Father      Prior to Admission medications   Medication Sig Start Date End Date Taking? Authorizing Provider  acyclovir (ZOVIRAX) 400 MG tablet Take 2 tablets (800 mg total) by mouth 5 (five) times daily. 05/17/13   Elson Areas, PA-C  amLODipine (NORVASC) 5 MG tablet Take 5 mg by mouth daily.    [provider]  cephALEXin (KEFLEX) 500 MG capsule Take 1 capsule (500 mg total) by mouth 4 (four) times daily. 05/17/13   Elson Areas, PA-C  doxycycline (VIBRAMYCIN) 100 MG capsule Take 1 capsule (100 mg total) by mouth 2 (two) times daily. 05/17/13   Elson Areas, PA-C  folic acid (FOLVITE) 1 MG tablet Take 1 mg by mouth daily.    [provider]  GLUCOSAMINE HCL PO Take 2,000 mg by mouth at bedtime as needed (joint pain).    [provider]  lisinopril-hydrochlorothiazide (PRINZIDE,ZESTORETIC) 20-25 MG per tablet Take 1 tablet by mouth daily.    [provider]  naproxen sodium (ANAPROX) 220 MG tablet Take 660 mg by mouth 2 (two) times daily  as needed (pain).    [provider]  omega-3 acid ethyl esters (LOVAZA) 1 G capsule Take 1 g by mouth 2 (two) times daily.    [provider]  oxyCODONE-acetaminophen (PERCOCET/ROXICET) 5-325 MG per tablet Take 1 tablet by mouth every 4 (four) hours as needed. 05/14/13   Garlon Hatchet, PA-C  oxyCODONE-acetaminophen (PERCOCET/ROXICET) 5-325 MG per tablet Take 2 tablets by mouth every 4 (four) hours as needed for severe pain. 05/17/13   Elson Areas, PA-C  predniSONE (DELTASONE) 10 MG tablet  Days 1-2, take 6 tablets Days 3-4, take 5 tablets Days 5-6, take 4 tablets Days 7-8, take 3 tablets Days 9-10, take 2 tablets Days 11-12, take 1 tablet 05/15/13   Garlon Hatchet, PA-C    Physical Exam: BP (!) 167/104   Pulse 88   Temp 98.2 F (36.8 C) (Oral)   Resp (!) 27   Ht  (1.753 m)   Wt (!) 244.9 kg   SpO2 91%   BMI 79.74 kg/m   General: Extremely obese gentleman lying on the stretcher in the ER, he is wearing 4 L nasal cannula oxygen.  Speaking full sentences, looks comfortable and nontoxic.  His daughter is at the bedside. Eyes: EOMI, clear conjuctivae, white sclerea Neck: supple, no masses, trachea mildline  Cardiovascular: RRR, no murmurs or rubs, no peripheral edema  Respiratory: clear to auscultation bilaterally with diminished breath sounds at the bilateral bases, no wheezes, no crackles  Abdomen: soft, nontender, nondistended, normal bowel tones heard  Skin: dry, no rashes  Musculoskeletal: no joint effusions, normal range of motion  Psychiatric: appropriate affect, normal speech  Neurologic: extraocular muscles intact, clear speech, moving all extremities with intact sensorium          Labs on Admission:  Basic Metabolic Panel: Recent Labs  Lab 06/20/22 1958  NA 131*  K 4.1  CL 99  CO2 23  GLUCOSE 122*  BUN 8  CREATININE 0.91  CALCIUM 9.0   Liver Function Tests: No results for input(s): "AST", "ALT", "ALKPHOS", "BILITOT", "PROT", "ALBUMIN" in the last 168 hours. No results for input(s): "LIPASE", "AMYLASE" in the last 168 hours. No results for input(s): "AMMONIA" in the last 168 hours. CBC: Recent Labs  Lab 06/20/22 1958  WBC 7.7  NEUTROABS 6.0  HGB 18.1*  HCT 54.0*  MCV 86.4  PLT 182   Cardiac Enzymes: No results for input(s): "CKTOTAL", "CKMB", "CKMBINDEX", "TROPONINI" in the last 168 hours.  BNP (last 3 results) Recent Labs    06/20/22 1959  BNP 85.9    ProBNP (last 3 results) No results for input(s): "PROBNP" in the last  8760 hours.  CBG: No results for input(s): "GLUCAP" in the last 168 hours.  Radiological Exams on Admission: DG Chest Port 1 View  Result Date: 06/20/2022 CLINICAL DATA:  Cough EXAM: PORTABLE CHEST 1 VIEW COMPARISON:  05/20/2013 FINDINGS: Right lower lobe opacity, suspicious for pneumonia. Mild left suprahilar opacity suggests possible multifocal pneumonia. No pleural effusion or pneumothorax. The heart is top-normal in size. No pleural effusion or pneumothorax. IMPRESSION: Suspected multifocal pneumonia, right lower lobe predominant. Electronically Signed   By: Charline Bills M.D.   On: 06/20/2022 20:10    Assessment/Plan Principal Problem: Multifocal CAP (community acquired pneumonia)-patient with cough, body aches, abnormal chest x-ray.  No leukocytosis, normal lactate. -Inpatient admission -Continue home supplemental oxygen, wean as tolerated -Empiric IV azithromycin and Rocephin -Note negative respiratory panel -Albuterol as needed  Hypertensive urgency-due to medication noncompliance, patient  essentially does not take his antihypertensive medication.   -Will increase amlodipine to 10 mg p.o. daily, first dose now  -Lisinopril 20 mg p.o. daily, first dose now  -Patient was strongly advised to be compliant with his blood pressure medication    Super obesity-BMI 79.7, complicating all aspects of care   Hyponatremia-mild and inconsequential, will follow with morning labs Acute on chronic respiratory failure with hypoxia-due to pneumonia, being treated as above  Healthcare maintenance-check fasting lipid panel, hemoglobin A1c with morning labs  DVT prophylaxis: Lovenox     Code Status: Full Code  Consults called: None  Admission status: The appropriate patient status for this patient is INPATIENT. Inpatient status is judged to be reasonable and necessary in order to provide the required intensity of service to ensure the patient's safety. The patient's presenting symptoms,  physical exam findings, and initial radiographic and laboratory data in the context of their chronic comorbidities is felt to place them at high risk for further clinical deterioration. Furthermore, it is not anticipated that the patient will be medically stable for discharge from the hospital within 2 midnights of admission.    I certify that at the point of admission it is my clinical judgment that the patient will require inpatient hospital care spanning beyond 2 midnights from the point of admission due to high intensity of service, high risk for further deterioration and high frequency of surveillance required   Time spent: 49 minutes  Emon Lance Sharlette Dense MD [facility] Pager [phone]  If 7PM-7AM, please contact night-coverage [email or web address] Password [facility]  06/21/2022, 8:05 AM

## 2022-06-21 NOTE — ED Notes (Signed)
Breakfast tray given. °

## 2022-06-22 DIAGNOSIS — J189 Pneumonia, unspecified organism: Secondary | ICD-10-CM | POA: Diagnosis not present

## 2022-06-22 LAB — LIPID PANEL
Cholesterol: 131 mg/dL (ref 0–200)
HDL: 27 mg/dL — ABNORMAL LOW (ref >40)
LDL Cholesterol: 81 mg/dL (ref 0–99)
Total CHOL/HDL Ratio: 4.9 RATIO
Triglycerides: 113 mg/dL (ref <150)
VLDL: 23 mg/dL (ref 0–40)

## 2022-06-22 LAB — BASIC METABOLIC PANEL
Anion gap: 9 (ref 5–15)
BUN: 9 mg/dL (ref 6–20)
CO2: 29 mmol/L (ref 22–32)
Calcium: 8.6 mg/dL — ABNORMAL LOW (ref 8.9–10.3)
Chloride: 94 mmol/L — ABNORMAL LOW (ref 98–111)
Creatinine, Ser: 0.76 mg/dL (ref 0.61–1.24)
GFR, Estimated: >60 mL/min (ref >60)
Glucose, Bld: 122 mg/dL — ABNORMAL HIGH (ref 70–99)
Potassium: 3.9 mmol/L (ref 3.5–5.1)
Sodium: 132 mmol/L — ABNORMAL LOW (ref 135–145)

## 2022-06-22 LAB — CBC
HCT: 50.2 % (ref 39.0–52.0)
Hemoglobin: 16 g/dL (ref 13.0–17.0)
MCH: 28.7 pg (ref 26.0–34.0)
MCHC: 31.9 g/dL (ref 30.0–36.0)
MCV: 90 fL (ref 80.0–100.0)
Platelets: 169 10*3/uL (ref 150–400)
RBC: 5.58 MIL/uL (ref 4.22–5.81)
RDW: 13.7 % (ref 11.5–15.5)
WBC: 5.6 10*3/uL (ref 4.0–10.5)
nRBC: 0 % (ref 0.0–0.2)

## 2022-06-22 LAB — HEMOGLOBIN A1C
Hgb A1c MFr Bld: 5.7 % — ABNORMAL HIGH (ref 4.8–5.6)
Mean Plasma Glucose: 116.89 mg/dL

## 2022-06-22 LAB — CULTURE, BLOOD (ROUTINE X 2)
Culture: NO GROWTH
Culture: NO GROWTH

## 2022-06-22 LAB — HIV ANTIBODY (ROUTINE TESTING W REFLEX): HIV Screen 4th Generation wRfx: NONREACTIVE

## 2022-06-22 MED ORDER — ORAL CARE MOUTH RINSE: 15.0000 mL | OROMUCOSAL | Status: DC | PRN

## 2022-06-22 NOTE — Consult Note (Signed)
WOC Nurse Consult Note: Reason for Consult:left pretibial healing wound, scab Wound type: healing full thickness Pressure Injury POA: Yes/No/NA Measurement:1cm round Wound bed:dry Drainage (amount, consistency, odor) none Periwound:intact Dressing procedure/placement/frequency:I have provided Nursing with conservative care guidance for this wound, cleansing it daily and applying a povidone-iodine swabstick and allowing to air-dry. No dressing is indicated.  WOC nursing team will not follow, but will remain available to this patient, the nursing and medical teams.  Please re-consult if needed.  Thank you for inviting [place] to participate in this patient's Plan of Care.  Ladona Mow, MSN, RN, CNS, GNP, Leda Min, [employer], [employer] phone:  [phone]

## 2022-06-22 NOTE — Hospital Course (Signed)
[name] is a 47 y.o. male with a history of hypertension, morbid obesity, psoriasis, OSA, OHS, chronic respiratory failure. Patient presented secondary to cough and malaise and was found to have evidence of multifocal pneumonia. Empiric Ceftriaxone and azithromycin started.

## 2022-06-22 NOTE — Plan of Care (Signed)
  Problem: Skin Integrity: Goal: Risk for impaired skin integrity will decrease Outcome: Progressing   Problem: Safety: Goal: Ability to remain free from injury will improve Outcome: Progressing   Problem: Pain Managment: Goal: General experience of comfort will improve Outcome: Progressing   Problem: Coping: Goal: Level of anxiety will decrease Outcome: Progressing   Problem: Activity: Goal: Risk for activity intolerance will decrease Outcome: Progressing   

## 2022-06-22 NOTE — Progress Notes (Signed)
RT note: Pt. seen per new CPAP order, stated that they use a Trilogy at home, Per protocol will obtain V60 for use being 4E/4W are both transitional units.

## 2022-06-22 NOTE — Progress Notes (Signed)
PROGRESS NOTE    CORDERRO KOLOSKI  VHQ:[id number] DOB: Dec 02, 1975 DOA: 06/20/2022 PCP: Default, Provider, MD   Brief Narrative: [name] is a 47 y.o. male with a history of hypertension, morbid obesity, psoriasis, OSA, OHS, chronic respiratory failure. Patient presented secondary to cough and malaise and was found to have evidence of multifocal pneumonia. Empiric Ceftriaxone and azithromycin started.   Assessment and Plan:  Multifocal pneumonia Present on admission and identified on chest x-ray imaging in right/left lower lobes. Complicated by underlying respiratory failure. Cough without leukocytosis or fevers noted. Blood cultures obtained on admission. Patient started empirically on Ceftriaxone and Azithromycin on admission. -Continue Ceftriaxone and Azithromycin  Chronic respiratory failure with hypoxia Baseline oxygen use of 2-3 L/min at baseline. Patient increased to 4 L/min this admission. -Wean to baseline 2-3 L/min -Ambulatory pulse ox  Obesity hypoventilation syndrome Obstructive sleep apnea Patient follows with pulmonology as an outpatient and uses Trilogy at night. -BiPAP qHS while inpatient  Severe asymptomatic hypertension Home amlodipine, lisinopril, hydrochlorothiazide resumed on admission with improvement of blood pressure. -Continue amlodipine, lisinopril and hydrochlorothiazide  Hyponatremia Mild. Possibly complicated by hydrochlorothiazide, but it is stable at this time.  Morbid obesity Estimated body mass index is 65.8 kg/m as calculated from the following:   Height as of this encounter:  (1.778 m).   Weight as of this encounter: 208 kg.  DVT prophylaxis: Lovenox Code Status:   Code Status: Full Code Family Communication: Daughter at bedside Disposition Plan: Discharge home likely in 1-2 days pending improvement of symptoms and transition to oral antibiotics   Consultants:  None  Procedures:   None  Antimicrobials: Ceftriaxone Azithromycin    Subjective: Patient reports cough. Feeling somewhat better.  Objective: BP (!) 157/84 (BP Location: Right Wrist)   Pulse 82   Temp 98 F (36.7 C) (Oral)   Resp 16   Ht  (1.778 m)   Wt (!) 208 kg   SpO2 95%   BMI 65.80 kg/m   Examination:  General exam: Appears calm and comfortable. Resting in bed. Respiratory system: Wheezing, mostly upper airway transmitted. Coarse breath sounds. Respiratory effort normal. Cardiovascular system: S1 & S2 heard, RRR. No murmurs, rubs, gallops or clicks. Gastrointestinal system: Abdomen is nondistended, soft and nontender. Normal bowel sounds heard. Central nervous system: Alert and oriented. No focal neurological deficits. Musculoskeletal: No calf tenderness Skin: No cyanosis. No rashes Psychiatry: Judgement and insight appear normal. Mood & affect appropriate.    Data Reviewed: I have personally reviewed following labs and imaging studies  CBC Lab Results  Component Value Date   WBC 5.6 06/22/2022   RBC 5.58 06/22/2022   HGB 16.0 06/22/2022   HCT 50.2 06/22/2022   MCV 90.0 06/22/2022   MCH 28.7 06/22/2022   PLT 169 06/22/2022   MCHC 31.9 06/22/2022   RDW 13.7 06/22/2022   LYMPHSABS 0.9 06/20/2022   MONOABS 0.6 06/20/2022   EOSABS 0.2 06/20/2022   BASOSABS 0.0 06/20/2022     Last metabolic panel Lab Results  Component Value Date   NA 132 (L) 06/22/2022   K 3.9 06/22/2022   CL 94 (L) 06/22/2022   CO2 29 06/22/2022   BUN 9 06/22/2022   CREATININE 0.76 06/22/2022   GLUCOSE 122 (H) 06/22/2022   GFRNONAA >60 06/22/2022   GFRAA 64 (L) 05/20/2013   CALCIUM 8.6 (L) 06/22/2022   PHOS 3.0 05/02/2010   PROT 5.6 (L) 05/20/2013   ALBUMIN 2.4 (L) 05/20/2013   BILITOT 0.4 05/20/2013  ALKPHOS 67 05/20/2013   AST 25 05/20/2013   ALT 31 05/20/2013   ANIONGAP 9 06/22/2022    GFR: Estimated Creatinine Clearance: 207.3 mL/min (by C-G formula based on SCr of 0.76  mg/dL).  Recent Results (from the past 240 hour(s))  Resp panel by RT-PCR (RSV, Flu A&B, Covid) Anterior Nasal Swab     Status: None   Collection Time: 06/20/22  7:10 PM   Specimen: Anterior Nasal Swab  Result Value Ref Range Status   SARS Coronavirus 2 by RT PCR NEGATIVE NEGATIVE Final    Comment: (NOTE) SARS-CoV-2 target nucleic acids are NOT DETECTED.  The SARS-CoV-2 RNA is generally detectable in upper respiratory specimens during the acute phase of infection. The lowest concentration of SARS-CoV-2 viral copies this assay can detect is 138 copies/mL. A negative result does not preclude SARS-Cov-2 infection and should not be used as the sole basis for treatment or other patient management decisions. A negative result may occur with  improper specimen collection/handling, submission of specimen other than nasopharyngeal swab, presence of viral mutation(s) within the areas targeted by this assay, and inadequate number of viral copies(<138 copies/mL). A negative result must be combined with clinical observations, patient history, and epidemiological information. The expected result is Negative.  Fact Sheet for Patients:  BloggerCourse.com  Fact Sheet for Healthcare Providers:  SeriousBroker.it  This test is no t yet approved or cleared by the [place] FDA and  has been authorized for detection and/or diagnosis of SARS-CoV-2 by FDA under an Emergency Use Authorization (EUA). This EUA will remain  in effect (meaning this test can be used) for the duration of the COVID-19 declaration under Section 564(b)(1) of the Act, 21 U.S.C.section 360bbb-3(b)(1), unless the authorization is terminated  or revoked sooner.       Influenza A by PCR NEGATIVE NEGATIVE Final   Influenza B by PCR NEGATIVE NEGATIVE Final    Comment: (NOTE) The Xpert Xpress SARS-CoV-2/FLU/RSV plus assay is intended as an aid in the diagnosis of influenza from  Nasopharyngeal swab specimens and should not be used as a sole basis for treatment. Nasal washings and aspirates are unacceptable for Xpert Xpress SARS-CoV-2/FLU/RSV testing.  Fact Sheet for Patients: BloggerCourse.com  Fact Sheet for Healthcare Providers: SeriousBroker.it  This test is not yet approved or cleared by the [place] FDA and has been authorized for detection and/or diagnosis of SARS-CoV-2 by FDA under an Emergency Use Authorization (EUA). This EUA will remain in effect (meaning this test can be used) for the duration of the COVID-19 declaration under Section 564(b)(1) of the Act, 21 U.S.C. section 360bbb-3(b)(1), unless the authorization is terminated or revoked.     Resp Syncytial Virus by PCR NEGATIVE NEGATIVE Final    Comment: (NOTE) Fact Sheet for Patients: BloggerCourse.com  Fact Sheet for Healthcare Providers: SeriousBroker.it  This test is not yet approved or cleared by the [place] FDA and has been authorized for detection and/or diagnosis of SARS-CoV-2 by FDA under an Emergency Use Authorization (EUA). This EUA will remain in effect (meaning this test can be used) for the duration of the COVID-19 declaration under Section 564(b)(1) of the Act, 21 U.S.C. section 360bbb-3(b)(1), unless the authorization is terminated or revoked.  Performed at [facility], [address] [place], [place] [address]   Culture, blood (routine x 2)     Status: None (Preliminary result)   Collection Time: 06/21/22  1:50 AM   Specimen: BLOOD LEFT HAND  Result Value Ref Range Status  Specimen Description   Final    BLOOD LEFT HAND Performed at [facility], [address] [place], [place] [address]    Special Requests   Final    BOTTLES DRAWN AEROBIC AND ANAEROBIC Blood Culture adequate volume Performed at [facility],  [address], [place], [place] [address]    Culture   Final    NO GROWTH 1 DAY Performed at [facility], [address], [place], [place] [address]    Report Status PENDING  Incomplete  Culture, blood (routine x 2)     Status: None (Preliminary result)   Collection Time: 06/21/22  2:01 AM   Specimen: BLOOD RIGHT HAND  Result Value Ref Range Status   Specimen Description   Final    BLOOD RIGHT HAND Performed at [facility], [address], [place], [place] [address]    Special Requests   Final    BOTTLES DRAWN AEROBIC AND ANAEROBIC Blood Culture adequate volume Performed at [facility], [address], [place], [place] [address]    Culture   Final    NO GROWTH 1 DAY Performed at [facility], [address], [place], [place] [address]    Report Status PENDING  Incomplete      Radiology Studies: DG Chest Port 1 View  Result Date: 06/20/2022 CLINICAL DATA:  Cough EXAM: PORTABLE CHEST 1 VIEW COMPARISON:  05/20/2013 FINDINGS: Right lower lobe opacity, suspicious for pneumonia. Mild left suprahilar opacity suggests possible multifocal pneumonia. No pleural effusion or pneumothorax. The heart is top-normal in size. No pleural effusion or pneumothorax. IMPRESSION: Suspected multifocal pneumonia, right lower lobe predominant. Electronically Signed   By: Charline Bills M.D.   On: 06/20/2022 20:10      LOS: 1 day    Jacquelin Hawking, MD [facility] 06/22/2022, 1:19 PM   If 7PM-7AM, please contact night-coverage [email or web address]

## 2022-06-22 NOTE — Progress Notes (Signed)
RT note: Pt. set up with V-60 BiPAP @ 0156 and is currently tolerating well.

## 2022-06-23 ENCOUNTER — Inpatient Hospital Stay (HOSPITAL_COMMUNITY): Payer: Medicaid Other

## 2022-06-23 DIAGNOSIS — J189 Pneumonia, unspecified organism: Secondary | ICD-10-CM | POA: Diagnosis not present

## 2022-06-23 LAB — CULTURE, BLOOD (ROUTINE X 2): Special Requests: ADEQUATE

## 2022-06-23 MED ORDER — IOHEXOL 350 MG/ML SOLN
100.0000 mL | Freq: Once | INTRAVENOUS | Status: AC | PRN
  Administered 2022-06-23: 100 mL via INTRAVENOUS

## 2022-06-23 MED ORDER — LEVOTHYROXINE SODIUM 50 MCG PO TABS
50.0000 ug | ORAL_TABLET | Freq: Every day | ORAL | Status: DC
  Administered 2022-06-24 – 2022-06-26 (×3): 50 ug via ORAL
  Filled 2022-06-23 (×3): qty 1

## 2022-06-23 MED ORDER — FUROSEMIDE 20 MG PO TABS
20.0000 mg | ORAL_TABLET | Freq: Every day | ORAL | Status: DC
  Administered 2022-06-23 – 2022-06-25 (×3): 20 mg via ORAL
  Filled 2022-06-23 (×3): qty 1

## 2022-06-23 MED ORDER — TOPIRAMATE 25 MG PO TABS
50.0000 mg | ORAL_TABLET | Freq: Every day | ORAL | Status: DC
  Administered 2022-06-23 – 2022-06-26 (×4): 50 mg via ORAL
  Filled 2022-06-23 (×4): qty 2

## 2022-06-23 MED ORDER — BUTALBITAL-APAP-CAFFEINE 50-325-40 MG PO TABS
2.0000 | ORAL_TABLET | Freq: Once | ORAL | Status: AC
  Administered 2022-06-23: 2 via ORAL
  Filled 2022-06-23: qty 2

## 2022-06-23 MED ORDER — PROCHLORPERAZINE EDISYLATE 10 MG/2ML IJ SOLN
5.0000 mg | Freq: Once | INTRAMUSCULAR | Status: AC | PRN
  Administered 2022-06-23: 5 mg via INTRAVENOUS
  Filled 2022-06-23: qty 2

## 2022-06-23 NOTE — Progress Notes (Signed)
RT note: In to see pt. for h/s BiPAP placement per order/home regimen to find staff with pt. after vomiting episode, currently alert and in no apparent distress while remaining on n/c, staff discussed continuous pulse oximetry placement in order to monitor pt., RT to follow.

## 2022-06-23 NOTE — Progress Notes (Signed)
Full consult note to follow, headache in a pt with multifocal PNA with chronic respiratory failure and BMI of 66, not WHOL. CTH read as some sulcal effacement and crowding of cisterns. On my review, some possible relative hyperdensity of the falx but no clear hemorrhage in the basal cisterns or IVH. I don't see a focal or surgical pathology, pt is unable to tolerate MRI for a multitude of reasons, consulted for further recs. Given that it's not WHOL, low suspicion for vascular pathology but given the difficulty of LP or obtaining a reliable ICP in the setting of this habitus in a patient that can't lie flat / etc, I think the best option is to get a CTA to rule out a vascular pathology and consult neurology for management of the headaches. Will see the patient and update recs accordingly.

## 2022-06-23 NOTE — Progress Notes (Signed)
RT note: Pt. stated, "no N/V episodes today", agreed to place BiPAP later this shift, RT to follow.

## 2022-06-23 NOTE — Progress Notes (Addendum)
       Overnight   NAME: [name] MRN: [id number] DOB : [birth year]    Date of Service   06/23/2022   HPI/Events of Note    Notified by RN for call from Radiologist in reference to Imaging. Recall to Radiologist - Non-contrast head CT at 0200 hrs   Interventions/ Plan   Recommendation is repeat CT at 0200 on 06/24/22 Bedside visit for assessment .      Chinita Greenland BSN MSNA MSN ACNPC-AG Acute Care Nurse Practitioner [facility]

## 2022-06-23 NOTE — Progress Notes (Addendum)
PROGRESS NOTE    [name]  ZOX:[id number] DOB: [birth year] DOA: 06/20/2022 PCP: Default, Provider, MD   Brief Narrative: [name] is a 47 y.o. male with a history of hypertension, morbid obesity, psoriasis, OSA, OHS, chronic respiratory failure. Patient presented secondary to cough and malaise and was found to have evidence of multifocal pneumonia. Empiric Ceftriaxone and azithromycin started.   Assessment and Plan:  Multifocal pneumonia Present on admission and identified on chest x-ray imaging in right/left lower lobes. Complicated by underlying respiratory failure. Cough without leukocytosis or fevers noted. Blood cultures obtained on admission. Patient started empirically on Ceftriaxone and Azithromycin on admission. -Continue Ceftriaxone and Azithromycin  Chronic respiratory failure with hypoxia Baseline oxygen use of 2-3 L/min at baseline. Patient increased to 4 L/min this admission. -Wean to baseline 2-3 L/min -Ambulatory pulse ox  Obesity hypoventilation syndrome Obstructive sleep apnea Patient follows with pulmonology as an outpatient and uses Trilogy at night. -BiPAP qHS while inpatient  Headache Unclear etiology. No focal neurologic deficit. -Fioricet -CT head  Addendum: CT head significant for near complete effacement of bilateral cerebral hemispheric sulci with evidence of small ventricles. MRI brain w/wo ordered but patient unable to tolerate. Called on-call neurosurgery, Dr. Maurice Small who recommended CTA head to rule out aneurysm, and if negative, treat headache. Stat CTA head ordered.  Severe asymptomatic hypertension Home amlodipine, lisinopril, hydrochlorothiazide resumed on admission with improvement of blood pressure. -Continue amlodipine, lisinopril and hydrochlorothiazide  Hyponatremia Mild. Possibly complicated by hydrochlorothiazide, but it is stable at this time.  Morbid obesity Estimated body mass index is 65.8 kg/m as calculated from the  following:   Height as of this encounter:  (1.778 m).   Weight as of this encounter: 208 kg.  DVT prophylaxis: Lovenox Code Status:   Code Status: Full Code Family Communication: Daughter at bedside Disposition Plan: Discharge home likely in 1 days pending improvement of symptoms and transition to oral antibiotics   Consultants:  None  Procedures:  None  Antimicrobials: Ceftriaxone Azithromycin    Subjective: Patient reports episodes of emesis overnight. None this morning. Continues to have a headache.  Objective: BP (!) 159/85 (BP Location: Right Arm)   Pulse 89   Temp 98.1 F (36.7 C) (Oral)   Resp (!) 21   Ht  (1.778 m)   Wt (!) 208 kg   SpO2 93%   BMI 65.80 kg/m   Examination:  General exam: Appears calm and somewhat uncomfortable  Respiratory system: Diminished. Respiratory effort normal. Cardiovascular system: S1 & S2 heard, RRR. Gastrointestinal system: Abdomen is nondistended, soft and nontender. Normal bowel sounds heard. Central nervous system: Alert and oriented. No focal neurological deficits. Musculoskeletal: Non-pitting edema noted bilaterally.  Psychiatry: Judgement and insight appear normal. Mood & affect appropriate.    Data Reviewed: I have personally reviewed following labs and imaging studies  CBC Lab Results  Component Value Date   WBC 5.6 06/22/2022   RBC 5.58 06/22/2022   HGB 16.0 06/22/2022   HCT 50.2 06/22/2022   MCV 90.0 06/22/2022   MCH 28.7 06/22/2022   PLT 169 06/22/2022   MCHC 31.9 06/22/2022   RDW 13.7 06/22/2022   LYMPHSABS 0.9 06/20/2022   MONOABS 0.6 06/20/2022   EOSABS 0.2 06/20/2022   BASOSABS 0.0 06/20/2022     Last metabolic panel Lab Results  Component Value Date   NA 132 (L) 06/22/2022   K 3.9 06/22/2022   CL 94 (L) 06/22/2022   CO2 29 06/22/2022   BUN  9 06/22/2022   CREATININE 0.76 06/22/2022   GLUCOSE 122 (H) 06/22/2022   GFRNONAA >60 06/22/2022   GFRAA 64 (L) 05/20/2013   CALCIUM 8.6  (L) 06/22/2022   PHOS 3.0 05/02/2010   PROT 5.6 (L) 05/20/2013   ALBUMIN 2.4 (L) 05/20/2013   BILITOT 0.4 05/20/2013   ALKPHOS 67 05/20/2013   AST 25 05/20/2013   ALT 31 05/20/2013   ANIONGAP 9 06/22/2022    GFR: Estimated Creatinine Clearance: 207.3 mL/min (by C-G formula based on SCr of 0.76 mg/dL).  Recent Results (from the past 240 hour(s))  Resp panel by RT-PCR (RSV, Flu A&B, Covid) Anterior Nasal Swab     Status: None   Collection Time: 06/20/22  7:10 PM   Specimen: Anterior Nasal Swab  Result Value Ref Range Status   SARS Coronavirus 2 by RT PCR NEGATIVE NEGATIVE Final    Comment: (NOTE) SARS-CoV-2 target nucleic acids are NOT DETECTED.  The SARS-CoV-2 RNA is generally detectable in upper respiratory specimens during the acute phase of infection. The lowest concentration of SARS-CoV-2 viral copies this assay can detect is 138 copies/mL. A negative result does not preclude SARS-Cov-2 infection and should not be used as the sole basis for treatment or other patient management decisions. A negative result may occur with  improper specimen collection/handling, submission of specimen other than nasopharyngeal swab, presence of viral mutation(s) within the areas targeted by this assay, and inadequate number of viral copies(<138 copies/mL). A negative result must be combined with clinical observations, patient history, and epidemiological information. The expected result is Negative.  Fact Sheet for Patients:  BloggerCourse.com  Fact Sheet for Healthcare Providers:  SeriousBroker.it  This test is no t yet approved or cleared by the [place] FDA and  has been authorized for detection and/or diagnosis of SARS-CoV-2 by FDA under an Emergency Use Authorization (EUA). This EUA will remain  in effect (meaning this test can be used) for the duration of the COVID-19 declaration under Section 564(b)(1) of the Act,  21 U.S.C.section 360bbb-3(b)(1), unless the authorization is terminated  or revoked sooner.       Influenza A by PCR NEGATIVE NEGATIVE Final   Influenza B by PCR NEGATIVE NEGATIVE Final    Comment: (NOTE) The Xpert Xpress SARS-CoV-2/FLU/RSV plus assay is intended as an aid in the diagnosis of influenza from Nasopharyngeal swab specimens and should not be used as a sole basis for treatment. Nasal washings and aspirates are unacceptable for Xpert Xpress SARS-CoV-2/FLU/RSV testing.  Fact Sheet for Patients: BloggerCourse.com  Fact Sheet for Healthcare Providers: SeriousBroker.it  This test is not yet approved or cleared by the [place] FDA and has been authorized for detection and/or diagnosis of SARS-CoV-2 by FDA under an Emergency Use Authorization (EUA). This EUA will remain in effect (meaning this test can be used) for the duration of the COVID-19 declaration under Section 564(b)(1) of the Act, 21 U.S.C. section 360bbb-3(b)(1), unless the authorization is terminated or revoked.     Resp Syncytial Virus by PCR NEGATIVE NEGATIVE Final    Comment: (NOTE) Fact Sheet for Patients: BloggerCourse.com  Fact Sheet for Healthcare Providers: SeriousBroker.it  This test is not yet approved or cleared by the [place] FDA and has been authorized for detection and/or diagnosis of SARS-CoV-2 by FDA under an Emergency Use Authorization (EUA). This EUA will remain in effect (meaning this test can be used) for the duration of the COVID-19 declaration under Section 564(b)(1) of the Act, 21 U.S.C. section 360bbb-3(b)(1), unless the authorization  is terminated or revoked.  Performed at [facility], [address], [place], [place] [address]   Culture, blood (routine x 2)     Status: None (Preliminary result)   Collection Time: 06/21/22  1:50 AM   Specimen:  BLOOD LEFT HAND  Result Value Ref Range Status   Specimen Description   Final    BLOOD LEFT HAND Performed at [facility], [address] [place], [place] [address]    Special Requests   Final    BOTTLES DRAWN AEROBIC AND ANAEROBIC Blood Culture adequate volume Performed at [facility], [address], [place], [place] [address]    Culture   Final    NO GROWTH 2 DAYS Performed at [facility], [address] [place], [place] [address]    Report Status PENDING  Incomplete  Culture, blood (routine x 2)     Status: None (Preliminary result)   Collection Time: 06/21/22  2:01 AM   Specimen: BLOOD RIGHT HAND  Result Value Ref Range Status   Specimen Description   Final    BLOOD RIGHT HAND Performed at [facility], [address] [place], [place] [address]    Special Requests   Final    BOTTLES DRAWN AEROBIC AND ANAEROBIC Blood Culture adequate volume Performed at [facility], [address] [place], [place] [address]    Culture   Final    NO GROWTH 2 DAYS Performed at [facility], [address] [place], [place] [address]    Report Status PENDING  Incomplete      Radiology Studies: No results found.    LOS: 2 days    Jacquelin Hawking, MD [facility] 06/23/2022, 1:52 PM   If 7PM-7AM, please contact night-coverage [email or web address]

## 2022-06-23 NOTE — TOC Initial Note (Addendum)
Transition of Care [facility]) - Initial/Assessment Note    Patient Details  Name: [name] MRN: [id number] Date of Birth: 1975-11-24  Transition of Care [facility]) CM/SW Contact:    Adrian Prows, RN Phone Number: 06/23/2022, 3:59 PM  Clinical Narrative:                 No PCP listed; spoke w/ pt in room; pt says his PCP is Dr Doralee Albino, [facility]; pt has home oxygen service w/ [employer]; pt says he will need a travel tank; contacted Jermaine at [facility]; he says travel tank will be delivered to pt's room; awaiting PT eval; TOC will follow.   Expected Discharge Plan: Home/Self Care Barriers to Discharge: Continued Medical Work up   Patient Goals and CMS Choice Patient states their goals for this hospitalization and ongoing recovery are:: home          Expected Discharge Plan and Services   Discharge Planning Services: CM Consult Post Acute Care Choice: Resumption of Svcs/PTA Provider (home oxygen w/ [employer]) Living arrangements for the past 2 months: Mobile Home                   DME Agency: [employer] Date DME Agency Contacted: 06/23/22 Time DME Agency Contacted: [id number] Representative spoke with at DME Agency: Vaughan Basta (pt's travel tank will be delivered to room)            Prior Living Arrangements/Services Living arrangements for the past 2 months: Mobile Home Lives with:: Self Patient language and need for interpreter reviewed:: Yes Do you feel safe going back to the place where you live?: Yes      Need for Family Participation in Patient Care: Yes (Comment) Care giver support system in place?: Yes (comment) Current home services: DME (home oxygen w/ [employer]) Criminal Activity/Legal Involvement Pertinent to Current Situation/Hospitalization: No - Comment as needed  Activities of Daily Living Home Assistive Devices/Equipment: CPAP, Other (Comment) ADL Screening (condition at time of admission) Patient's cognitive ability adequate to  safely complete daily activities?: Yes Is the patient deaf or have difficulty hearing?: No Does the patient have difficulty seeing, even when wearing glasses/contacts?: No Does the patient have difficulty concentrating, remembering, or making decisions?: No Patient able to express need for assistance with ADLs?: Yes Does the patient have difficulty dressing or bathing?: Yes Independently performs ADLs?: Yes (appropriate for developmental age) Does the patient have difficulty walking or climbing stairs?: Yes Weakness of Legs: None Weakness of Arms/Hands: None  Permission Sought/Granted Permission sought to share information with : Case Manager Permission granted to share information with : Yes, Verbal Permission Granted  Share Information with NAME: Burnard Bunting, RN, CM     Permission granted to share info w Relationship: Nelma Rothman (sister) [phone]     Emotional Assessment Appearance:: Appears stated age Attitude/Demeanor/Rapport: Gracious Affect (typically observed): Accepting Orientation: : Oriented to Self, Oriented to Place, Oriented to  Time, Oriented to Situation Alcohol / Substance Use: Not Applicable Psych Involvement: No (comment)  Admission diagnosis:  Multifocal pneumonia [J18.9] Patient Active Problem List   Diagnosis Date Noted   Hypertensive urgency 06/21/2022   Super obesity 06/21/2022   CAP (community acquired pneumonia) 06/21/2022   Hyponatremia 06/21/2022   Chronic respiratory failure with hypoxia 06/21/2022   Noncompliance with medication regimen 06/21/2022   Multifocal pneumonia 06/21/2022   Extreme obesity 12/08/2013   EM (erythema multiforme) 05/24/2013   Cellulitis 05/20/2013   Sepsis 05/20/2013   BP (  high blood pressure) 04/15/2013   PCP:  Default, Provider, MD Pharmacy:   [phone] [place], [place] - [address] [place] [address] Phone: [phone] Fax:  [phone]  [id number] [place], [place] - [address] [facility] [address] [place] [address] Phone: [phone] Fax: [phone]  [id number] [place], [place] - [address] [place] [address] Phone: [phone] Fax: [phone]     Social Determinants of Health (SDOH) Social History: SDOH Screenings   Food Insecurity: No Food Insecurity (06/23/2022)  Housing: Low Risk  (06/23/2022)  Transportation Needs: No Transportation Needs (06/23/2022)  Utilities: Not At Risk (06/23/2022)  Tobacco Use: Unknown (06/20/2022)   SDOH Interventions: Food Insecurity Interventions: Inpatient TOC Housing Interventions: Inpatient TOC Transportation Interventions: Inpatient TOC Utilities Interventions: Inpatient TOC   Readmission Risk Interventions     No data to display

## 2022-06-24 ENCOUNTER — Inpatient Hospital Stay (HOSPITAL_COMMUNITY): Payer: Medicaid Other

## 2022-06-24 DIAGNOSIS — J189 Pneumonia, unspecified organism: Secondary | ICD-10-CM | POA: Diagnosis not present

## 2022-06-24 LAB — CULTURE, BLOOD (ROUTINE X 2)

## 2022-06-24 MED ORDER — LOPERAMIDE HCL 2 MG PO CAPS
2.0000 mg | ORAL_CAPSULE | Freq: Once | ORAL | Status: AC
  Administered 2022-06-24: 2 mg via ORAL
  Filled 2022-06-24: qty 1

## 2022-06-24 MED ORDER — ALBUTEROL SULFATE (2.5 MG/3ML) 0.083% IN NEBU: 2.5000 mg | INHALATION_SOLUTION | RESPIRATORY_TRACT | Status: DC | PRN

## 2022-06-24 NOTE — Progress Notes (Signed)
PROGRESS NOTE    LUISANTONIO Kelly  WUJ:[id number] DOB: [birth year] DOA: 06/20/2022 PCP: Default, Provider, MD   Brief Narrative: [name] is a 47 y.o. male with a history of hypertension, morbid obesity, psoriasis, OSA, OHS, chronic respiratory failure. Patient presented secondary to cough and malaise and was found to have evidence of multifocal pneumonia. Empiric Ceftriaxone and azithromycin started.   Assessment and Plan:  Multifocal pneumonia Present on admission and identified on chest x-ray imaging in right/left lower lobes. Complicated by underlying respiratory failure. Cough without leukocytosis or fevers noted. Blood cultures obtained on admission. Patient started empirically on Ceftriaxone and Azithromycin on admission. -Continue Ceftriaxone and Azithromycin  Chronic respiratory failure with hypoxia Baseline oxygen use of 2-3 L/min at baseline. Patient increased to 4 L/min this admission. -Wean to baseline 2-3 L/min -Ambulatory pulse ox -Albuterol PRN  Obesity hypoventilation syndrome Obstructive sleep apnea Patient follows with pulmonology as an outpatient and uses Trilogy at night. -BiPAP qHS while inpatient  Headache Unclear etiology. No focal neurologic deficit. Initial CT head significant for near complete effacement of bilateral cerebral hemispheric sulci with evidence of small ventricles. MRI attempted, but unsuccessful secondary to body habitus constraints. Neurosurgery consulted and recommended a CTA head; unfortunately this also was unsuccessful secondary to body habitus constraints. Repeat head CT with without persistent findings of initial CT. CT also significant for evidence suggesting left maxillary sinusitis, which could be contributing to patients headache symptoms. -Continue 3rd generation antibiotics (patient has a pencillin allergy); will likely need prolonged antibiotic course to treat probable left maxillary sinusitis  Severe asymptomatic  hypertension Home amlodipine, lisinopril, hydrochlorothiazide resumed on admission with improvement of blood pressure. -Continue amlodipine, lisinopril and hydrochlorothiazide  Hyponatremia Mild. Possibly complicated by hydrochlorothiazide, but it is stable at this time.  Morbid obesity Estimated body mass index is 65.8 kg/m as calculated from the following:   Height as of this encounter: 5\' 10"  (1.778 m).   Weight as of this encounter: 208 kg.  DVT prophylaxis: Lovenox Code Status:   Code Status: Full Code Family Communication: None at bedside Disposition Plan: Discharge home likely in 1 days pending improvement of symptoms and transition to oral antibiotics   Consultants:  Neurosurgery  Procedures:  None  Antimicrobials: Ceftriaxone Azithromycin    Subjective: Patient reports some mild improvement of headache. Some dyspnea.  Objective: BP (!) 157/77 (BP Location: Left Arm)   Pulse 76   Temp 97.9 F (36.6 C) (Oral)   Resp 18   Ht 5\' 10"  (1.778 m)   Wt (!) 208 kg   SpO2 93%   BMI 65.80 kg/m   Examination:  General exam: Appears calm and comfortable Respiratory system: Wheezing bilaterally. Respiratory effort normal. Cardiovascular system: S1 & S2 heard, RRR. Gastrointestinal system: Abdomen is nondistended, soft and nontender. Normal bowel sounds heard. Central nervous system: Alert and oriented. No focal neurological deficits. Musculoskeletal: Non-pitting edema. Psychiatry: Judgement and insight appear normal. Mood & affect appropriate.    Data Reviewed: I have personally reviewed following labs and imaging studies  CBC Lab Results  Component Value Date   WBC 5.6 06/22/2022   RBC 5.58 06/22/2022   HGB 16.0 06/22/2022   HCT 50.2 06/22/2022   MCV 90.0 06/22/2022   MCH 28.7 06/22/2022   PLT 169 06/22/2022   MCHC 31.9 06/22/2022   RDW 13.7 06/22/2022   LYMPHSABS 0.9 06/20/2022   MONOABS 0.6 06/20/2022   EOSABS 0.2 06/20/2022   BASOSABS 0.0  06/20/2022     Last metabolic  panel Lab Results  Component Value Date   NA 132 (L) 06/22/2022   K 3.9 06/22/2022   CL 94 (L) 06/22/2022   CO2 29 06/22/2022   BUN 9 06/22/2022   CREATININE 0.76 06/22/2022   GLUCOSE 122 (H) 06/22/2022   GFRNONAA >60 06/22/2022   GFRAA 64 (L) 05/20/2013   CALCIUM 8.6 (L) 06/22/2022   PHOS 3.0 05/02/2010   PROT 5.6 (L) 05/20/2013   ALBUMIN 2.4 (L) 05/20/2013   BILITOT 0.4 05/20/2013   ALKPHOS 67 05/20/2013   AST 25 05/20/2013   ALT 31 05/20/2013   ANIONGAP 9 06/22/2022    GFR: Estimated Creatinine Clearance: 207.3 mL/min (by C-G formula based on SCr of 0.76 mg/dL).  Recent Results (from the past 240 hour(s))  Resp panel by RT-PCR (RSV, Flu A&B, Covid) Anterior Nasal Swab     Status: None   Collection Time: 06/20/22  7:10 PM   Specimen: Anterior Nasal Swab  Result Value Ref Range Status   SARS Coronavirus 2 by RT PCR NEGATIVE NEGATIVE Final    Comment: (NOTE) SARS-CoV-2 target nucleic acids are NOT DETECTED.  The SARS-CoV-2 RNA is generally detectable in upper respiratory specimens during the acute phase of infection. The lowest concentration of SARS-CoV-2 viral copies this assay can detect is 138 copies/mL. A negative result does not preclude SARS-Cov-2 infection and should not be used as the sole basis for treatment or other patient management decisions. A negative result may occur with  improper specimen collection/handling, submission of specimen other than nasopharyngeal swab, presence of viral mutation(s) within the areas targeted by this assay, and inadequate number of viral copies(<138 copies/mL). A negative result must be combined with clinical observations, patient history, and epidemiological information. The expected result is Negative.  Fact Sheet for Patients:  BloggerCourse.com  Fact Sheet for Healthcare Providers:  SeriousBroker.it  This test is no t yet approved or  cleared by the [place] FDA and  has been authorized for detection and/or diagnosis of SARS-CoV-2 by FDA under an Emergency Use Authorization (EUA). This EUA will remain  in effect (meaning this test can be used) for the duration of the COVID-19 declaration under Section 564(b)(1) of the Act, 21 U.S.C.section 360bbb-3(b)(1), unless the authorization is terminated  or revoked sooner.       Influenza A by PCR NEGATIVE NEGATIVE Final   Influenza B by PCR NEGATIVE NEGATIVE Final    Comment: (NOTE) The Xpert Xpress SARS-CoV-2/FLU/RSV plus assay is intended as an aid in the diagnosis of influenza from Nasopharyngeal swab specimens and should not be used as a sole basis for treatment. Nasal washings and aspirates are unacceptable for Xpert Xpress SARS-CoV-2/FLU/RSV testing.  Fact Sheet for Patients: BloggerCourse.com  Fact Sheet for Healthcare Providers: SeriousBroker.it  This test is not yet approved or cleared by the [place] FDA and has been authorized for detection and/or diagnosis of SARS-CoV-2 by FDA under an Emergency Use Authorization (EUA). This EUA will remain in effect (meaning this test can be used) for the duration of the COVID-19 declaration under Section 564(b)(1) of the Act, 21 U.S.C. section 360bbb-3(b)(1), unless the authorization is terminated or revoked.     Resp Syncytial Virus by PCR NEGATIVE NEGATIVE Final    Comment: (NOTE) Fact Sheet for Patients: BloggerCourse.com  Fact Sheet for Healthcare Providers: SeriousBroker.it  This test is not yet approved or cleared by the [place] FDA and has been authorized for detection and/or diagnosis of SARS-CoV-2 by FDA under an Emergency Use Authorization (EUA). This  EUA will remain in effect (meaning this test can be used) for the duration of the COVID-19 declaration under Section 564(b)(1) of the Act, 21  U.S.C. section 360bbb-3(b)(1), unless the authorization is terminated or revoked.  Performed at [facility], [address], [place], [place] [address]   Culture, blood (routine x 2)     Status: None (Preliminary result)   Collection Time: 06/21/22  1:50 AM   Specimen: BLOOD LEFT HAND  Result Value Ref Range Status   Specimen Description   Final    BLOOD LEFT HAND Performed at [facility], [address], [place], [place] [address]    Special Requests   Final    BOTTLES DRAWN AEROBIC AND ANAEROBIC Blood Culture adequate volume Performed at [facility], [address] [place], [place] [address]    Culture   Final    NO GROWTH 3 DAYS Performed at [facility], [address] [place], [place] [address]    Report Status PENDING  Incomplete  Culture, blood (routine x 2)     Status: None (Preliminary result)   Collection Time: 06/21/22  2:01 AM   Specimen: BLOOD RIGHT HAND  Result Value Ref Range Status   Specimen Description   Final    BLOOD RIGHT HAND Performed at [facility], [address], [place], [place] [address]    Special Requests   Final    BOTTLES DRAWN AEROBIC AND ANAEROBIC Blood Culture adequate volume Performed at [facility], [address], [place], [place] [address]    Culture   Final    NO GROWTH 3 DAYS Performed at [facility], [address] [place], [place] [address]    Report Status PENDING  Incomplete      Radiology Studies: CT HEAD WO CONTRAST ( )  Result Date: 06/24/2022 CLINICAL DATA:  Headache and fever EXAM: CT HEAD WITHOUT CONTRAST TECHNIQUE: Contiguous axial images were obtained from the base of the skull through the vertex without intravenous contrast. RADIATION DOSE REDUCTION: This exam was performed according to the departmental dose-optimization program which includes automated exposure control, adjustment of the mA and/or kV according to patient size  and/or use of iterative reconstruction technique. COMPARISON:  06/23/2022 FINDINGS: Brain: There is no mass, hemorrhage or extra-axial collection. The size and configuration of the ventricles and extra-axial CSF spaces are normal. The brain parenchyma is normal, without acute or chronic infarction. Vascular: No abnormal hyperdensity of the major intracranial arteries or dural venous sinuses. No intracranial atherosclerosis. Skull: The visualized skull base, calvarium and extracranial soft tissues are normal. Sinuses/Orbits: Left maxillary sinus mucosal thickening. The orbits are normal. IMPRESSION: Normal head CT. Electronically Signed   By: Deatra Robinson M.D.   On: 06/24/2022 03:08   CT HEAD WO CONTRAST ( )  Result Date: 06/23/2022 CLINICAL DATA:  Headache. EXAM: CT HEAD WITHOUT CONTRAST TECHNIQUE: Contiguous axial images were obtained from the base of the skull through the vertex without intravenous contrast. RADIATION DOSE REDUCTION: This exam was performed according to the departmental dose-optimization program which includes automated exposure control, adjustment of the mA and/or kV according to patient size and/or use of iterative reconstruction technique. COMPARISON:  None Available. FINDINGS: Brain: Ventricles are small in size with apparent near-complete effacement of the bilateral cerebral hemispheric sulci. The basal cisterns also appear slightly crowded. No CT evidence of an acute infarct. No evidence of hemorrhage. No extra-axial fluid collection. Vascular: Diffusely hyperdense appearance of the  intracranial vasculature. Skull: Normal. Negative for fracture or focal lesion. Sinuses/Orbits: There is an air-fluid level in the left maxillary sinus which can be seen in the setting of acute sinusitis. There is mucosal thickening level in the left-sided ethmoid air cells. No mastoid or middle ear effusion. Orbits are unremarkable. Other: None. IMPRESSION: 1. Ventricles are small in size with apparent  near-complete effacement of the bilateral cerebral hemispheric sulci. The basal cisterns also appear slightly crowded. Findings are nonspecific and may be related to age, but further evaluation with a contrast enhanced brain MRI is recommended for more definitive characterization. 2. Air-fluid level in the left maxillary sinus which can be seen in the setting of acute sinusitis. These results will be called to the ordering clinician or representative by the Radiologist Assistant, and communication documented in the PACS or [employer]. Electronically Signed   By: Lorenza Cambridge M.D.   On: 06/23/2022 15:02      LOS: 3 days    Jacquelin Hawking, MD [facility] 06/24/2022, 10:04 AM   If 7PM-7AM, please contact night-coverage [email or web address]

## 2022-06-24 NOTE — Progress Notes (Signed)
SATURATION QUALIFICATIONS: (This note is used to comply with regulatory documentation for home oxygen)  Patient Saturations on Room Air at Rest = 85%  Patient Saturations on Room Air while Ambulating = 84%  Patient Saturations on 5 Liters of oxygen while Ambulating = 89%  Please briefly explain why patient needs home oxygen: Patient on 2-3L oxygen at home during ambulation sating 86%-3L increased oxygen to 5L sating 89%, Patient SOB with activity.

## 2022-06-24 NOTE — Progress Notes (Signed)
Pt is not ready for bipap at this time.  This writer will return around 2300 per pt request.

## 2022-06-24 NOTE — Progress Notes (Signed)
RT note: Pt. placed on BiPAP V-60, tolerating well, was delayed placing on due to possible CT.

## 2022-06-25 DIAGNOSIS — J189 Pneumonia, unspecified organism: Secondary | ICD-10-CM | POA: Diagnosis not present

## 2022-06-25 LAB — CBC
HCT: 55 % — ABNORMAL HIGH (ref 39.0–52.0)
Hemoglobin: 17.4 g/dL — ABNORMAL HIGH (ref 13.0–17.0)
MCH: 28.4 pg (ref 26.0–34.0)
MCHC: 31.6 g/dL (ref 30.0–36.0)
MCV: 89.9 fL (ref 80.0–100.0)
Platelets: 236 10*3/uL (ref 150–400)
RBC: 6.12 MIL/uL — ABNORMAL HIGH (ref 4.22–5.81)
RDW: 13.3 % (ref 11.5–15.5)
WBC: 6.8 10*3/uL (ref 4.0–10.5)
nRBC: 0 % (ref 0.0–0.2)

## 2022-06-25 LAB — COMPREHENSIVE METABOLIC PANEL
ALT: 40 U/L (ref 0–44)
AST: 42 U/L — ABNORMAL HIGH (ref 15–41)
Albumin: 3.2 g/dL — ABNORMAL LOW (ref 3.5–5.0)
Alkaline Phosphatase: 59 U/L (ref 38–126)
Anion gap: 10 (ref 5–15)
BUN: 14 mg/dL (ref 6–20)
CO2: 34 mmol/L — ABNORMAL HIGH (ref 22–32)
Calcium: 9.1 mg/dL (ref 8.9–10.3)
Chloride: 92 mmol/L — ABNORMAL LOW (ref 98–111)
Creatinine, Ser: 0.9 mg/dL (ref 0.61–1.24)
GFR, Estimated: >60 mL/min (ref >60)
Glucose, Bld: 108 mg/dL — ABNORMAL HIGH (ref 70–99)
Potassium: 4.3 mmol/L (ref 3.5–5.1)
Sodium: 136 mmol/L (ref 135–145)
Total Bilirubin: 1.1 mg/dL (ref 0.3–1.2)
Total Protein: 7.9 g/dL (ref 6.5–8.1)

## 2022-06-25 MED ORDER — LOPERAMIDE HCL 2 MG PO CAPS: 2.0000 mg | ORAL_CAPSULE | Freq: Four times a day (QID) | ORAL | Status: DC | PRN

## 2022-06-25 MED ORDER — FUROSEMIDE 10 MG/ML IJ SOLN
20.0000 mg | Freq: Every day | INTRAMUSCULAR | Status: DC
  Administered 2022-06-25 – 2022-06-26 (×2): 20 mg via INTRAVENOUS
  Filled 2022-06-25 (×2): qty 2

## 2022-06-25 NOTE — Progress Notes (Signed)
PROGRESS NOTE    [name]  WUJ:[id number] DOB: May 01, 1975 DOA: 06/20/2022 PCP: Default, Provider, MD   Brief Narrative:47 y.o. male with a history of hypertension, morbid obesity, psoriasis, OSA, OHS, chronic respiratory failure. Patient presented secondary to cough and malaise and was found to have evidence of multifocal pneumonia. Empiric Ceftriaxone and azithromycin started.   Assessment & Plan:   Principal Problem:   CAP (community acquired pneumonia) Active Problems:   Noncompliance with medication regimen   Hypertensive urgency   Super obesity   Hyponatremia   Chronic respiratory failure with hypoxia   Multifocal pneumonia   CAP-patient admitted with cough and shortness of breath.  Chest x-ray shows right and left lower lobe consolidations.  His oxygen requirement was increased on admission.  At baseline he is on 2 to 3 L of oxygen.  He feels he is still not ready to go home today. Continue IV antibiotics. Hopefully home in a.m.   Chronic respiratory failure with hypoxia Baseline oxygen use of 2-3 L/min at baseline. Patient increased to 4 L/min this admission. -Wean to baseline 2-3 L/min -Ambulatory pulse ox -Albuterol PRN   Obesity hypoventilation syndrome Obstructive sleep apnea Patient follows with pulmonology as an outpatient and uses Trilogy at night. -BiPAP qHS while inpatient   Headache-question secondary to left maxillary sinusitis?  Pseudotumor cerebri-denies blurred vision or double vision nausea vomiting however he continues to complain of headache Blood pressure elevated 156/96  No focal neurologic deficit. Initial CT head significant for near complete effacement of bilateral cerebral hemispheric sulci with evidence of small ventricles. MRI attempted, but unsuccessful secondary to body habitus constraints. Neurosurgery consulted and recommended a CTA head; unfortunately this also was unsuccessful secondary to body habitus constraints. Repeat head CT with  without persistent findings of initial CT. CT also significant for evidence suggesting left maxillary sinusitis, which could be contributing to patients headache symptoms. -Continue 3rd generation antibiotics (patient has a pencillin allergy); will likely need prolonged antibiotic course to treat probable left maxillary sinusitis   Severe asymptomatic hypertension Home amlodipine, lisinopril, hydrochlorothiazide resumed on admission with improvement of blood pressure. -Continue amlodipine, lisinopril and hydrochlorothiazide Continue Lasix   Hyponatremia Mild. Possibly complicated by hydrochlorothiazide, but it is stable at this time.   Morbid obesity Estimated body mass index is 65.8 kg/m as calculated from the following:   Height as of this encounter:  (1.778 m).   Weight as of this encounter: 208 kg.   Estimated body mass index is 65.8 kg/m as calculated from the following:   Height as of this encounter:  (1.778 m).   Weight as of this encounter: 208 kg.  DVT prophylaxis: Lovenox Code Status: Full code  family Communication: None at bedside  disposition Plan:  Status is: Inpatient Remains inpatient appropriate because: Pneumonia with new hypoxia   Consultants:  Neurosurgery  Procedures: None Antimicrobials: Rocephin and azithromycin  Subjective: Still continues to complain of a headache Still continues to complain of shortness of breath denies chest pain  Objective: Vitals:   06/25/22 0400 06/25/22 0534 06/25/22 0600 06/25/22 1317  BP:  (!) 165/92  (!) 156/96  Pulse: 67 79  79  Resp:  Temp:  97.7 F (36.5 C)  97.8 F (36.6 C)  TempSrc:  Oral  Oral  SpO2:  92%  92%  Weight:      Height:        Intake/Output Summary (Last 24 hours) at 06/25/2022 1440 Last data filed at 06/25/2022  1317 Gross per 24 hour  Intake 360 ml  Output 700 ml  Net -340 ml   Filed Weights   06/20/22 1907 06/21/22 1346  Weight: (!) 244.9 kg (!) 208 kg     Examination:  General exam: Appears in no acute distress  respiratory system: Rhonchi to auscultation. Respiratory effort normal. Cardiovascular system: S1 & S2 heard, RRR. No JVD, murmurs, rubs, gallops or clicks. No pedal edema. Gastrointestinal system: Abdomen is nondistended, soft and nontender. No organomegaly or masses felt. Normal bowel sounds heard. Central nervous system: Alert and oriented. No focal neurological deficits. Extremities: Symmetric 5 x 5 power. Skin: No rashes, lesions or ulcers Psychiatry: Judgement and insight appear normal. Mood & affect appropriate.     Data Reviewed: I have personally reviewed following labs and imaging studies  CBC: Recent Labs  Lab 06/20/22 1958 06/22/22 0531  WBC 7.7 5.6  NEUTROABS 6.0  --   HGB 18.1* 16.0  HCT 54.0* 50.2  MCV 86.4 90.0  PLT 182 169   Basic Metabolic Panel: Recent Labs  Lab 06/20/22 1958 06/22/22 0531  NA 131* 132*  K 4.1 3.9  CL 99 94*  CO2 23 29  GLUCOSE 122* 122*  BUN 8 9  CREATININE 0.91 0.76  CALCIUM 9.0 8.6*   GFR: Estimated Creatinine Clearance: 207.3 mL/min (by C-G formula based on SCr of 0.76 mg/dL). Liver Function Tests: No results for input(s): "AST", "ALT", "ALKPHOS", "BILITOT", "PROT", "ALBUMIN" in the last 168 hours. No results for input(s): "LIPASE", "AMYLASE" in the last 168 hours. No results for input(s): "AMMONIA" in the last 168 hours. Coagulation Profile: No results for input(s): "INR", "PROTIME" in the last 168 hours. Cardiac Enzymes: No results for input(s): "CKTOTAL", "CKMB", "CKMBINDEX", "TROPONINI" in the last 168 hours. BNP (last 3 results) No results for input(s): "PROBNP" in the last 8760 hours. HbA1C: No results for input(s): "HGBA1C" in the last 72 hours. CBG: No results for input(s): "GLUCAP" in the last 168 hours. Lipid Profile: No results for input(s): "CHOL", "HDL", "LDLCALC", "TRIG", "CHOLHDL", "LDLDIRECT" in the last 72 hours. Thyroid Function  Tests: No results for input(s): "TSH", "T4TOTAL", "FREET4", "T3FREE", "THYROIDAB" in the last 72 hours. Anemia Panel: No results for input(s): "VITAMINB12", "FOLATE", "FERRITIN", "TIBC", "IRON", "RETICCTPCT" in the last 72 hours. Sepsis Labs: Recent Labs  Lab 06/21/22 0150  LATICACIDVEN 0.9    Recent Results (from the past 240 hour(s))  Resp panel by RT-PCR (RSV, Flu A&B, Covid) Anterior Nasal Swab     Status: None   Collection Time: 06/20/22  7:10 PM   Specimen: Anterior Nasal Swab  Result Value Ref Range Status   SARS Coronavirus 2 by RT PCR NEGATIVE NEGATIVE Final    Comment: (NOTE) SARS-CoV-2 target nucleic acids are NOT DETECTED.  The SARS-CoV-2 RNA is generally detectable in upper respiratory specimens during the acute phase of infection. The lowest concentration of SARS-CoV-2 viral copies this assay can detect is 138 copies/mL. A negative result does not preclude SARS-Cov-2 infection and should not be used as the sole basis for treatment or other patient management decisions. A negative result may occur with  improper specimen collection/handling, submission of specimen other than nasopharyngeal swab, presence of viral mutation(s) within the areas targeted by this assay, and inadequate number of viral copies(<138 copies/mL). A negative result must be combined with clinical observations, patient history, and epidemiological information. The expected result is Negative.  Fact Sheet for Patients:  BloggerCourse.com  Fact Sheet for Healthcare Providers:  SeriousBroker.it  This test  is no t yet approved or cleared by the [place] and  has been authorized for detection and/or diagnosis of SARS-CoV-2 by FDA under an Emergency Use Authorization (EUA). This EUA will remain  in effect (meaning this test can be used) for the duration of the COVID-19 declaration under Section 564(b)(1) of the Act, 21 U.S.C.section  360bbb-3(b)(1), unless the authorization is terminated  or revoked sooner.       Influenza A by PCR NEGATIVE NEGATIVE Final   Influenza B by PCR NEGATIVE NEGATIVE Final    Comment: (NOTE) The Xpert Xpress SARS-CoV-2/FLU/RSV plus assay is intended as an aid in the diagnosis of influenza from Nasopharyngeal swab specimens and should not be used as a sole basis for treatment. Nasal washings and aspirates are unacceptable for Xpert Xpress SARS-CoV-2/FLU/RSV testing.  Fact Sheet for Patients: BloggerCourse.com  Fact Sheet for Healthcare Providers: SeriousBroker.it  This test is not yet approved or cleared by the [place] FDA and has been authorized for detection and/or diagnosis of SARS-CoV-2 by FDA under an Emergency Use Authorization (EUA). This EUA will remain in effect (meaning this test can be used) for the duration of the COVID-19 declaration under Section 564(b)(1) of the Act, 21 U.S.C. section 360bbb-3(b)(1), unless the authorization is terminated or revoked.     Resp Syncytial Virus by PCR NEGATIVE NEGATIVE Final    Comment: (NOTE) Fact Sheet for Patients: BloggerCourse.com  Fact Sheet for Healthcare Providers: SeriousBroker.it  This test is not yet approved or cleared by the [place] FDA and has been authorized for detection and/or diagnosis of SARS-CoV-2 by FDA under an Emergency Use Authorization (EUA). This EUA will remain in effect (meaning this test can be used) for the duration of the COVID-19 declaration under Section 564(b)(1) of the Act, 21 U.S.C. section 360bbb-3(b)(1), unless the authorization is terminated or revoked.  Performed at [facility], [address], [place], [place] [address]   Culture, blood (routine x 2)     Status: None (Preliminary result)   Collection Time: 06/21/22  1:50 AM   Specimen: BLOOD LEFT HAND   Result Value Ref Range Status   Specimen Description   Final    BLOOD LEFT HAND Performed at [facility], [address] [place], [place] [address]    Special Requests   Final    BOTTLES DRAWN AEROBIC AND ANAEROBIC Blood Culture adequate volume Performed at [facility], [address] [place], [place] [address]    Culture   Final    NO GROWTH 4 DAYS Performed at [facility], [address] [place], [place] [address]    Report Status PENDING  Incomplete  Culture, blood (routine x 2)     Status: None (Preliminary result)   Collection Time: 06/21/22  2:01 AM   Specimen: BLOOD RIGHT HAND  Result Value Ref Range Status   Specimen Description   Final    BLOOD RIGHT HAND Performed at [facility], [address] [place], [place] [address]    Special Requests   Final    BOTTLES DRAWN AEROBIC AND ANAEROBIC Blood Culture adequate volume Performed at [facility], [address], [place], [place] [address]    Culture   Final    NO GROWTH 4 DAYS Performed at [facility], [address] [place], [place] [address]    Report Status PENDING  Incomplete         Radiology Studies: CT  HEAD WO CONTRAST ( )  Result Date: 06/24/2022 CLINICAL DATA:  Headache and fever EXAM: CT HEAD WITHOUT CONTRAST TECHNIQUE: Contiguous axial images were obtained from the base of the skull through the vertex without intravenous contrast. RADIATION DOSE REDUCTION: This exam was performed according to the departmental dose-optimization program which includes automated exposure control, adjustment of the mA and/or kV according to patient size and/or use of iterative reconstruction technique. COMPARISON:  06/23/2022 FINDINGS: Brain: There is no mass, hemorrhage or extra-axial collection. The size and configuration of the ventricles and extra-axial CSF spaces are normal. The brain parenchyma is normal, without acute or chronic infarction.  Vascular: No abnormal hyperdensity of the major intracranial arteries or dural venous sinuses. No intracranial atherosclerosis. Skull: The visualized skull base, calvarium and extracranial soft tissues are normal. Sinuses/Orbits: Left maxillary sinus mucosal thickening. The orbits are normal. IMPRESSION: Normal head CT. Electronically Signed   By: Deatra Robinson M.D.   On: 06/24/2022 03:08   CT HEAD WO CONTRAST ( )  Result Date: 06/23/2022 CLINICAL DATA:  Headache. EXAM: CT HEAD WITHOUT CONTRAST TECHNIQUE: Contiguous axial images were obtained from the base of the skull through the vertex without intravenous contrast. RADIATION DOSE REDUCTION: This exam was performed according to the departmental dose-optimization program which includes automated exposure control, adjustment of the mA and/or kV according to patient size and/or use of iterative reconstruction technique. COMPARISON:  None Available. FINDINGS: Brain: Ventricles are small in size with apparent near-complete effacement of the bilateral cerebral hemispheric sulci. The basal cisterns also appear slightly crowded. No CT evidence of an acute infarct. No evidence of hemorrhage. No extra-axial fluid collection. Vascular: Diffusely hyperdense appearance of the intracranial vasculature. Skull: Normal. Negative for fracture or focal lesion. Sinuses/Orbits: There is an air-fluid level in the left maxillary sinus which can be seen in the setting of acute sinusitis. There is mucosal thickening level in the left-sided ethmoid air cells. No mastoid or middle ear effusion. Orbits are unremarkable. Other: None. IMPRESSION: 1. Ventricles are small in size with apparent near-complete effacement of the bilateral cerebral hemispheric sulci. The basal cisterns also appear slightly crowded. Findings are nonspecific and may be related to age, but further evaluation with a contrast enhanced brain MRI is recommended for more definitive characterization. 2. Air-fluid level  in the left maxillary sinus which can be seen in the setting of acute sinusitis. These results will be called to the ordering clinician or representative by the Radiologist Assistant, and communication documented in the PACS or [employer]. Electronically Signed   By: Lorenza Cambridge M.D.   On: 06/23/2022 15:02        Scheduled Meds:  amLODipine  10 mg Oral Daily   docusate sodium  100 mg Oral BID   enoxaparin (LOVENOX) injection  100 mg Subcutaneous QHS   furosemide  20 mg Oral Daily   lisinopril  20 mg Oral Daily   And   hydrochlorothiazide  25 mg Oral Daily   levothyroxine  50 mcg Oral QAC breakfast   topiramate  50 mg Oral Daily   Continuous Infusions:  cefTRIAXone (ROCEPHIN)  IV 2 g (06/24/22 2120)     LOS: 4 days    Time spent: 38 min  Alwyn Ren, MD  06/25/2022, 2:40 PM

## 2022-06-26 DIAGNOSIS — J189 Pneumonia, unspecified organism: Secondary | ICD-10-CM | POA: Diagnosis not present

## 2022-06-26 LAB — COMPREHENSIVE METABOLIC PANEL
ALT: 44 U/L (ref 0–44)
AST: 42 U/L — ABNORMAL HIGH (ref 15–41)
Albumin: 3.3 g/dL — ABNORMAL LOW (ref 3.5–5.0)
Alkaline Phosphatase: 58 U/L (ref 38–126)
Anion gap: 9 (ref 5–15)
BUN: 14 mg/dL (ref 6–20)
CO2: 30 mmol/L (ref 22–32)
Calcium: 9 mg/dL (ref 8.9–10.3)
Chloride: 95 mmol/L — ABNORMAL LOW (ref 98–111)
Creatinine, Ser: 0.67 mg/dL (ref 0.61–1.24)
GFR, Estimated: >60 mL/min (ref >60)
Glucose, Bld: 146 mg/dL — ABNORMAL HIGH (ref 70–99)
Potassium: 3.8 mmol/L (ref 3.5–5.1)
Sodium: 134 mmol/L — ABNORMAL LOW (ref 135–145)
Total Bilirubin: 0.5 mg/dL (ref 0.3–1.2)
Total Protein: 7.6 g/dL (ref 6.5–8.1)

## 2022-06-26 LAB — CBC
HCT: 55.7 % — ABNORMAL HIGH (ref 39.0–52.0)
Hemoglobin: 17.6 g/dL — ABNORMAL HIGH (ref 13.0–17.0)
MCH: 28.4 pg (ref 26.0–34.0)
MCHC: 31.6 g/dL (ref 30.0–36.0)
MCV: 89.8 fL (ref 80.0–100.0)
Platelets: 236 10*3/uL (ref 150–400)
RBC: 6.2 MIL/uL — ABNORMAL HIGH (ref 4.22–5.81)
RDW: 13.4 % (ref 11.5–15.5)
WBC: 7.6 10*3/uL (ref 4.0–10.5)
nRBC: 0 % (ref 0.0–0.2)

## 2022-06-26 LAB — CULTURE, BLOOD (ROUTINE X 2): Special Requests: ADEQUATE

## 2022-06-26 MED ORDER — AMLODIPINE BESYLATE 10 MG PO TABS: 10.0000 mg | ORAL_TABLET | Freq: Every day | ORAL | 2 refills | Status: AC

## 2022-06-26 MED ORDER — SALINE SPRAY 0.65 % NA SOLN
1.0000 | NASAL | Status: DC | PRN
  Filled 2022-06-26: qty 44

## 2022-06-26 MED ORDER — POVIDONE-IODINE 10 % EX SWAB: 1.0000 | CUTANEOUS | 12 refills | Status: DC | PRN

## 2022-06-26 NOTE — TOC Transition Note (Signed)
Transition of Care [facility]) - CM/SW Discharge Note   Patient Details  Name: [name] MRN: [id number] Date of Birth: 1975/09/07  Transition of Care [facility] CM/SW Contact:  Lanier Clam, RN Phone Number: 06/26/2022, 2:32 PM   Clinical Narrative: Spoke to patient about d/c plans. Patient already has home 02 travel tank w/[employer]-in his car. Has pcp,pharmacy, & transport @ d/c. No further CM needs.     Final next level of care: Home/Self Care Barriers to Discharge: No Barriers Identified   Patient Goals and CMS Choice      Discharge Placement                         Discharge Plan and Services Additional resources added to the After Visit Summary for     Discharge Planning Services: CM Consult Post Acute Care Choice: Resumption of Svcs/PTA Provider (home oxygen w/ [employer])            DME Agency: [employer] Date DME Agency Contacted: 06/23/22 Time DME Agency Contacted: [phone] Representative spoke with at DME Agency: Vaughan Basta (pt's travel tank will be delivered to room)            Social Determinants of Health (SDOH) Interventions SDOH Screenings   Food Insecurity: No Food Insecurity (06/23/2022)  Housing: Low Risk  (06/23/2022)  Transportation Needs: No Transportation Needs (06/23/2022)  Utilities: Not At Risk (06/23/2022)  Tobacco Use: Unknown (06/20/2022)     Readmission Risk Interventions     No data to display

## 2022-06-26 NOTE — Discharge Summary (Signed)
Physician Discharge Summary  [name] GEX:[id number] DOB: 1976/01/25 DOA: 06/20/2022  PCP: Default, Provider, MD  Admit date: 06/20/2022 Discharge date: 06/26/2022  Admitted From: Home Disposition: Home Recommendations for Outpatient Follow-up:  Follow up with PCP in 1-2 weeks Please obtain BMP/CBC in one week:  Home Health: None Equipment/Devices: None  Discharge Condition: Stable CODE STATUS: Full Diet recommendation: Cardiac Brief/Interim Summary:  [name] is a 47 y.o. male with a history of hypertension, morbid obesity, psoriasis, OSA, OHS, chronic respiratory failure. Patient presented secondary to cough and malaise and was found to have evidence of multifocal pneumonia.   Discharge Diagnoses:  Principal Problem:   CAP (community acquired pneumonia) Active Problems:   Noncompliance with medication regimen   Hypertensive urgency   Super obesity   Hyponatremia   Chronic respiratory failure with hypoxia   Multifocal pneumonia     Multifocal pneumonia-he was admitted with cough and shortness of breath.  Chest x-ray showed right and left lower lobe consolidation.  He finished a course of treatment for 5 days with azithromycin and Rocephin. He was discharged home without any antibiotics.  Chronic respiratory failure with hypoxia Baseline oxygen use of 2-3 L/min at baseline.  He was on an his baseline oxygen prior to discharge.  Obesity hypoventilation syndrome Obstructive sleep apnea Patient follows with pulmonology as an outpatient and uses Trilogy at night.   Headache Unclear etiology. No focal neurologic deficit. Initial CT head significant for near complete effacement of bilateral cerebral hemispheric sulci with evidence of small ventricles. MRI attempted, but unsuccessful secondary to body habitus constraints. Neurosurgery consulted and recommended a CTA head; unfortunately this also was unsuccessful secondary to body habitus constraints.CT also significant for  evidence suggesting left maxillary sinusitis, which could be contributing to patients headache symptoms.   Severe asymptomatic hypertension -Continue amlodipine, lisinopril and hydrochlorothiazide   Hyponatremia- Mild. Possibly complicated by hydrochlorothiazide, but it is stable at this time.     Morbid obesity- Estimated body mass index is 65.8 kg/m as calculated from the following:   Height as of this encounter: 5\' 10"  (1.778 m).   Weight as of this encounter: 208 kg.   Estimated body mass index is 65.8 kg/m as calculated from the following:   Height as of this encounter: 5\' 10"  (1.778 m).   Weight as of this encounter: 208 kg.  Discharge Instructions  Discharge Instructions     Diet - low sodium heart healthy   Complete by: As directed    Discharge wound care:   Complete by: As directed    Wound care to the left tibia healing wound-wash with soap and water rinse and dry.  Pain with povidone iodine and allowed to air dry leave open to air.   Increase activity slowly   Complete by: As directed       Allergies as of 06/26/2022       Reactions   Penicillins Hives   Dilaudid [hydromorphone Hcl] Itching        Medication List     STOP taking these medications    acyclovir 400 MG tablet Commonly known as: ZOVIRAX   cephALEXin 500 MG capsule Commonly known as: KEFLEX   doxycycline 100 MG capsule Commonly known as: VIBRAMYCIN   predniSONE 10 MG tablet Commonly known as: DELTASONE       TAKE these medications    amLODipine 10 MG tablet Commonly known as: NORVASC Take 1 tablet (10 mg total) by mouth daily. What changed:  medication strength how  much to take Notes to patient: Last Dose of this Medication was given on June 26, 2022 at 10:47 am   ergocalciferol 1.25 MG (50000 UT) capsule Commonly known as: VITAMIN D2 Take 50,000 Units by mouth 2 (two) times a week. Notes to patient: Please resume schedule for this Medication which is to be taken 2  times per week.   furosemide 20 MG tablet Commonly known as: LASIX Take 20 mg by mouth daily. Notes to patient: Last Dose of this Medication was given on June 26, 2022 at 10:43 am   levothyroxine 50 MCG tablet Commonly known as: SYNTHROID Take 50 mcg by mouth daily before breakfast. Notes to patient: Last Dose of this Medication was given on April 18. 2024 at 5:44 am   lisinopril-hydrochlorothiazide 20-25 MG tablet Commonly known as: ZESTORETIC Take 1 tablet by mouth daily.   oxyCODONE-acetaminophen 5-325 MG tablet Commonly known as: PERCOCET/ROXICET Take 2 tablets by mouth every 4 (four) hours as needed for severe pain. What changed: Another medication with the same name was removed. Continue taking this medication, and follow the directions you see here.   povidone-iodine 10 % swab Apply 1 Application topically as needed.   ProAir HFA 108 (90 Base) MCG/ACT inhaler Generic drug: albuterol Inhale 2 puffs into the lungs every 4 (four) hours as needed for wheezing.   topiramate 50 MG tablet Commonly known as: TOPAMAX Take 50 mg by mouth daily. Notes to patient: Last Dose of this Medication was given on June 26, 2022 at 10:47 am               Discharge Care Instructions  (From admission, onward)           Start     Ordered   06/26/22 0000  Discharge wound care:       Comments: Wound care to the left tibia healing wound-wash with soap and water rinse and dry.  Pain with povidone iodine and allowed to air dry leave open to air.   06/26/22 0916            Allergies  Allergen Reactions   Penicillins Hives   Dilaudid [Hydromorphone Hcl] Itching    Consultations:none  Procedures/Studies: CT HEAD WO CONTRAST ( )  Result Date: 06/24/2022 CLINICAL DATA:  Headache and fever EXAM: CT HEAD WITHOUT CONTRAST TECHNIQUE: Contiguous axial images were obtained from the base of the skull through the vertex without intravenous contrast. RADIATION DOSE REDUCTION:  This exam was performed according to the departmental dose-optimization program which includes automated exposure control, adjustment of the mA and/or kV according to patient size and/or use of iterative reconstruction technique. COMPARISON:  06/23/2022 FINDINGS: Brain: There is no mass, hemorrhage or extra-axial collection. The size and configuration of the ventricles and extra-axial CSF spaces are normal. The brain parenchyma is normal, without acute or chronic infarction. Vascular: No abnormal hyperdensity of the major intracranial arteries or dural venous sinuses. No intracranial atherosclerosis. Skull: The visualized skull base, calvarium and extracranial soft tissues are normal. Sinuses/Orbits: Left maxillary sinus mucosal thickening. The orbits are normal. IMPRESSION: Normal head CT. Electronically Signed   By: Deatra Robinson M.D.   On: 06/24/2022 03:08   CT HEAD WO CONTRAST ( )  Result Date: 06/23/2022 CLINICAL DATA:  Headache. EXAM: CT HEAD WITHOUT CONTRAST TECHNIQUE: Contiguous axial images were obtained from the base of the skull through the vertex without intravenous contrast. RADIATION DOSE REDUCTION: This exam was performed according to the departmental dose-optimization program which includes automated exposure control, adjustment  of the mA and/or kV according to patient size and/or use of iterative reconstruction technique. COMPARISON:  None Available. FINDINGS: Brain: Ventricles are small in size with apparent near-complete effacement of the bilateral cerebral hemispheric sulci. The basal cisterns also appear slightly crowded. No CT evidence of an acute infarct. No evidence of hemorrhage. No extra-axial fluid collection. Vascular: Diffusely hyperdense appearance of the intracranial vasculature. Skull: Normal. Negative for fracture or focal lesion. Sinuses/Orbits: There is an air-fluid level in the left maxillary sinus which can be seen in the setting of acute sinusitis. There is mucosal  thickening level in the left-sided ethmoid air cells. No mastoid or middle ear effusion. Orbits are unremarkable. Other: None. IMPRESSION: 1. Ventricles are small in size with apparent near-complete effacement of the bilateral cerebral hemispheric sulci. The basal cisterns also appear slightly crowded. Findings are nonspecific and may be related to age, but further evaluation with a contrast enhanced brain MRI is recommended for more definitive characterization. 2. Air-fluid level in the left maxillary sinus which can be seen in the setting of acute sinusitis. These results will be called to the ordering clinician or representative by the Radiologist Assistant, and communication documented in the PACS or [employer]. Electronically Signed   By: Lorenza Cambridge M.D.   On: 06/23/2022 15:02   DG Chest Port 1 View  Result Date: 06/20/2022 CLINICAL DATA:  Cough EXAM: PORTABLE CHEST 1 VIEW COMPARISON:  05/20/2013 FINDINGS: Right lower lobe opacity, suspicious for pneumonia. Mild left suprahilar opacity suggests possible multifocal pneumonia. No pleural effusion or pneumothorax. The heart is top-normal in size. No pleural effusion or pneumothorax. IMPRESSION: Suspected multifocal pneumonia, right lower lobe predominant. Electronically Signed   By: Charline Bills M.D.   On: 06/20/2022 20:10   (Echo, Carotid, EGD, Colonoscopy, ERCP)    Subjective: No new c/o Anxious to go home  Discharge Exam: Vitals:   06/26/22 0900 06/26/22 1158  BP: (!) 149/82 (!) 146/89  Pulse: 83 86  Resp: (!) 24 20  Temp: 98.2 F (36.8 C) 97.9 F (36.6 C)  SpO2: 91% 90%   Vitals:   06/26/22 0337 06/26/22 0527 06/26/22 0900 06/26/22 1158  BP:  (!) 154/99 (!) 149/82 (!) 146/89  Pulse: 73 80 83 86  Resp: (!) 25 20 (!) 24 20  Temp:  98.1 F (36.7 C) 98.2 F (36.8 C) 97.9 F (36.6 C)  TempSrc:  Oral Oral Oral  SpO2: 93% 94% 91% 90%  Weight:      Height:        General: Pt is alert, awake, not in acute  distress Cardiovascular: RRR, S1/S2 +, no rubs, no gallops Respiratory: Few rhonchi bilaterally, no wheezing, no rhonchi Abdominal: Soft, NT, ND, bowel sounds + Extremities: no edema, no cyanosis    The results of significant diagnostics from this hospitalization (including imaging, microbiology, ancillary and laboratory) are listed below for reference.     Microbiology: Recent Results (from the past 240 hour(s))  Resp panel by RT-PCR (RSV, Flu A&B, Covid) Anterior Nasal Swab     Status: None   Collection Time: 06/20/22  7:10 PM   Specimen: Anterior Nasal Swab  Result Value Ref Range Status   SARS Coronavirus 2 by RT PCR NEGATIVE NEGATIVE Final    Comment: (NOTE) SARS-CoV-2 target nucleic acids are NOT DETECTED.  The SARS-CoV-2 RNA is generally detectable in upper respiratory specimens during the acute phase of infection. The lowest concentration of SARS-CoV-2 viral copies this assay can detect is 138 copies/mL. A negative  result does not preclude SARS-Cov-2 infection and should not be used as the sole basis for treatment or other patient management decisions. A negative result may occur with  improper specimen collection/handling, submission of specimen other than nasopharyngeal swab, presence of viral mutation(s) within the areas targeted by this assay, and inadequate number of viral copies(<138 copies/mL). A negative result must be combined with clinical observations, patient history, and epidemiological information. The expected result is Negative.  Fact Sheet for Patients:  BloggerCourse.com  Fact Sheet for Healthcare Providers:  SeriousBroker.it  This test is no t yet approved or cleared by the [place] FDA and  has been authorized for detection and/or diagnosis of SARS-CoV-2 by FDA under an Emergency Use Authorization (EUA). This EUA will remain  in effect (meaning this test can be used) for the duration of  the COVID-19 declaration under Section 564(b)(1) of the Act, 21 U.S.C.section 360bbb-3(b)(1), unless the authorization is terminated  or revoked sooner.       Influenza A by PCR NEGATIVE NEGATIVE Final   Influenza B by PCR NEGATIVE NEGATIVE Final    Comment: (NOTE) The Xpert Xpress SARS-CoV-2/FLU/RSV plus assay is intended as an aid in the diagnosis of influenza from Nasopharyngeal swab specimens and should not be used as a sole basis for treatment. Nasal washings and aspirates are unacceptable for Xpert Xpress SARS-CoV-2/FLU/RSV testing.  Fact Sheet for Patients: BloggerCourse.com  Fact Sheet for Healthcare Providers: SeriousBroker.it  This test is not yet approved or cleared by the [place] FDA and has been authorized for detection and/or diagnosis of SARS-CoV-2 by FDA under an Emergency Use Authorization (EUA). This EUA will remain in effect (meaning this test can be used) for the duration of the COVID-19 declaration under Section 564(b)(1) of the Act, 21 U.S.C. section 360bbb-3(b)(1), unless the authorization is terminated or revoked.     Resp Syncytial Virus by PCR NEGATIVE NEGATIVE Final    Comment: (NOTE) Fact Sheet for Patients: BloggerCourse.com  Fact Sheet for Healthcare Providers: SeriousBroker.it  This test is not yet approved or cleared by the [place] FDA and has been authorized for detection and/or diagnosis of SARS-CoV-2 by FDA under an Emergency Use Authorization (EUA). This EUA will remain in effect (meaning this test can be used) for the duration of the COVID-19 declaration under Section 564(b)(1) of the Act, 21 U.S.C. section 360bbb-3(b)(1), unless the authorization is terminated or revoked.  Performed at [facility], [address] [place], [place] [address]   Culture, blood (routine x 2)     Status: None   Collection  Time: 06/21/22  1:50 AM   Specimen: BLOOD LEFT HAND  Result Value Ref Range Status   Specimen Description   Final    BLOOD LEFT HAND Performed at [facility], [address], [place], [place] [address]    Special Requests   Final    BOTTLES DRAWN AEROBIC AND ANAEROBIC Blood Culture adequate volume Performed at [facility], [address] [place], [place] [address]    Culture   Final    NO GROWTH 5 DAYS Performed at [facility], [address] [place], [place] [address]    Report Status 06/26/2022 FINAL  Final  Culture, blood (routine x 2)     Status: None   Collection Time: 06/21/22  2:01 AM   Specimen: BLOOD RIGHT HAND  Result Value Ref Range Status   Specimen Description   Final    BLOOD RIGHT HAND Performed  at [facility], [address] [place], [place] [address]    Special Requests   Final    BOTTLES DRAWN AEROBIC AND ANAEROBIC Blood Culture adequate volume Performed at [facility], [address] [place], [place] [address]    Culture   Final    NO GROWTH 5 DAYS Performed at [facility], [address], [place], [place] [address]    Report Status 06/26/2022 FINAL  Final     Labs: BNP (last 3 results) Recent Labs    06/20/22 1959  BNP 85.9   Basic Metabolic Panel: Recent Labs  Lab 06/20/22 1958 06/22/22 0531 06/25/22 1539 06/26/22 0918  NA 131* 132* 136 134*  K 4.1 3.9 4.3 3.8  CL 99 94* 92* 95*  CO2 23 29 34* 30  GLUCOSE 122* 122* 108* 146*  BUN 8 9 14 14   CREATININE 0.91 0.76 0.90 0.67  CALCIUM 9.0 8.6* 9.1 9.0   Liver Function Tests: Recent Labs  Lab 06/25/22 1539 06/26/22 0918  AST 42* 42*  ALT 40 44  ALKPHOS 59 58  BILITOT 1.1 0.5  PROT 7.9 7.6  ALBUMIN 3.2* 3.3*   No results for input(s): "LIPASE", "AMYLASE" in the last 168 hours. No results for input(s): "AMMONIA" in the last 168 hours. CBC: Recent Labs  Lab 06/20/22 1958 06/22/22 0531 06/25/22 1539  06/26/22 0918  WBC 7.7 5.6 6.8 7.6  NEUTROABS 6.0  --   --   --   HGB 18.1* 16.0 17.4* 17.6*  HCT 54.0* 50.2 55.0* 55.7*  MCV 86.4 90.0 89.9 89.8  PLT 182 169 236 236   Cardiac Enzymes: No results for input(s): "CKTOTAL", "CKMB", "CKMBINDEX", "TROPONINI" in the last 168 hours. BNP: Invalid input(s): "POCBNP" CBG: No results for input(s): "GLUCAP" in the last 168 hours. D-Dimer No results for input(s): "DDIMER" in the last 72 hours. Hgb A1c No results for input(s): "HGBA1C" in the last 72 hours. Lipid Profile No results for input(s): "CHOL", "HDL", "LDLCALC", "TRIG", "CHOLHDL", "LDLDIRECT" in the last 72 hours. Thyroid function studies No results for input(s): "TSH", "T4TOTAL", "T3FREE", "THYROIDAB" in the last 72 hours.  Invalid input(s): "FREET3" Anemia work up No results for input(s): "VITAMINB12", "FOLATE", "FERRITIN", "TIBC", "IRON", "RETICCTPCT" in the last 72 hours. Urinalysis    Component Value Date/Time   COLORURINE AMBER (A) 05/20/2013 2153   APPEARANCEUR CLEAR 05/20/2013 2153   LABSPEC 1.028 05/20/2013 2153   PHURINE 5.5 05/20/2013 2153   GLUCOSEU NEGATIVE 05/20/2013 2153   HGBUR NEGATIVE 05/20/2013 2153   BILIRUBINUR SMALL (A) 05/20/2013 2153   KETONESUR NEGATIVE 05/20/2013 2153   PROTEINUR NEGATIVE 05/20/2013 2153   UROBILINOGEN 0.2 05/20/2013 2153   NITRITE NEGATIVE 05/20/2013 2153   LEUKOCYTESUR NEGATIVE 05/20/2013 2153   Sepsis Labs Recent Labs  Lab 06/20/22 1958 06/22/22 0531 06/25/22 1539 06/26/22 0918  WBC 7.7 5.6 6.8 7.6   Microbiology Recent Results (from the past 240 hour(s))  Resp panel by RT-PCR (RSV, Flu A&B, Covid) Anterior Nasal Swab     Status: None   Collection Time: 06/20/22  7:10 PM   Specimen: Anterior Nasal Swab  Result Value Ref Range Status   SARS Coronavirus 2 by RT PCR NEGATIVE NEGATIVE Final    Comment: (NOTE) SARS-CoV-2 target nucleic acids are NOT DETECTED.  The SARS-CoV-2 RNA is generally detectable in upper  respiratory specimens during the acute phase of infection. The lowest concentration of SARS-CoV-2 viral copies this assay can detect is 138 copies/mL. A negative result  does not preclude SARS-Cov-2 infection and should not be used as the sole basis for treatment or other patient management decisions. A negative result may occur with  improper specimen collection/handling, submission of specimen other than nasopharyngeal swab, presence of viral mutation(s) within the areas targeted by this assay, and inadequate number of viral copies(<138 copies/mL). A negative result must be combined with clinical observations, patient history, and epidemiological information. The expected result is Negative.  Fact Sheet for Patients:  BloggerCourse.com  Fact Sheet for Healthcare Providers:  SeriousBroker.it  This test is no t yet approved or cleared by the [place] FDA and  has been authorized for detection and/or diagnosis of SARS-CoV-2 by FDA under an Emergency Use Authorization (EUA). This EUA will remain  in effect (meaning this test can be used) for the duration of the COVID-19 declaration under Section 564(b)(1) of the Act, 21 U.S.C.section 360bbb-3(b)(1), unless the authorization is terminated  or revoked sooner.       Influenza A by PCR NEGATIVE NEGATIVE Final   Influenza B by PCR NEGATIVE NEGATIVE Final    Comment: (NOTE) The Xpert Xpress SARS-CoV-2/FLU/RSV plus assay is intended as an aid in the diagnosis of influenza from Nasopharyngeal swab specimens and should not be used as a sole basis for treatment. Nasal washings and aspirates are unacceptable for Xpert Xpress SARS-CoV-2/FLU/RSV testing.  Fact Sheet for Patients: BloggerCourse.com  Fact Sheet for Healthcare Providers: SeriousBroker.it  This test is not yet approved or cleared by the [place] FDA and has been  authorized for detection and/or diagnosis of SARS-CoV-2 by FDA under an Emergency Use Authorization (EUA). This EUA will remain in effect (meaning this test can be used) for the duration of the COVID-19 declaration under Section 564(b)(1) of the Act, 21 U.S.C. section 360bbb-3(b)(1), unless the authorization is terminated or revoked.     Resp Syncytial Virus by PCR NEGATIVE NEGATIVE Final    Comment: (NOTE) Fact Sheet for Patients: BloggerCourse.com  Fact Sheet for Healthcare Providers: SeriousBroker.it  This test is not yet approved or cleared by the [place] FDA and has been authorized for detection and/or diagnosis of SARS-CoV-2 by FDA under an Emergency Use Authorization (EUA). This EUA will remain in effect (meaning this test can be used) for the duration of the COVID-19 declaration under Section 564(b)(1) of the Act, 21 U.S.C. section 360bbb-3(b)(1), unless the authorization is terminated or revoked.  Performed at [facility], [address], [place], [place] [address]   Culture, blood (routine x 2)     Status: None   Collection Time: 06/21/22  1:50 AM   Specimen: BLOOD LEFT HAND  Result Value Ref Range Status   Specimen Description   Final    BLOOD LEFT HAND Performed at [facility], [address], [place], [place] [address]    Special Requests   Final    BOTTLES DRAWN AEROBIC AND ANAEROBIC Blood Culture adequate volume Performed at [facility], [address], [place], [place] [address]    Culture   Final    NO GROWTH 5 DAYS Performed at [facility], [address] [place], [place] [address]    Report Status 06/26/2022 FINAL  Final  Culture, blood (routine x 2)     Status: None   Collection Time: 06/21/22  2:01 AM   Specimen: BLOOD RIGHT HAND  Result Value Ref Range Status   Specimen Description   Final    BLOOD RIGHT HAND Performed at [facility], [address] [place], [place] [address]    Special Requests   Final    BOTTLES DRAWN AEROBIC AND ANAEROBIC Blood Culture adequate volume Performed at [facility], [address], [place], [place] [address]    Culture   Final    NO GROWTH 5 DAYS Performed at [facility], [address] [place], [place] [address]    Report Status 06/26/2022 FINAL  Final     Time coordinating discharge: 38 minutes  SIGNED:  Alwyn Ren, MD  [facility] 06/26/2022, 2:00 PM

## 2022-06-26 NOTE — Progress Notes (Signed)
Patient to be discharged to home today. All Discharge instructions including all discharge Medications and schedules for these Medications reviewed with the Patient. Patient verbalized understanding of all discharge teaching. Discharge AVS with the Patient at time of discharge

## 2023-05-25 NOTE — Progress Notes (Signed)
 Subjective:     Patient ID: [name]. is a 48 y.o. (DOB 05/26/1975) male.  Chief Complaint  Patient presents with  . Leg Swelling    C/o bilateral leg swelling, pain, discoloration    HPI  [name].  is a pleasant 48 y.o. male who presents today as a new patient to discuss worsening swelling and redness to his right lower extremity. He has chronic swelling of both legs but intermittently the right leg would get worse. After a few days, it would improve but last week it wasn't improving and became very painful. He presented to the ED and was diagnosed with cellulitis and started on Keflex . Unfortunately, it has been unchanged since then. He does walk most of the time but is a wheelchair today due to pain. He has seen his primary and is interested in pursuing bariatric surgery as he is now over 500lbs. He does not smoke. Of note, he is on amlodipine  as well. Denies known history of DVT. No claudication or ischemic rest pain. No other concerns noted today  The patient's allergies, current medications, past family history, past medical history, past social history, past surgical history and problem list were reviewed and updated as appropriate.   Review of Systems    CONSTITUTIONAL:  Fever, chills, or night sweats? no GENERAL:  Fatigue?  yes Unexpected weight loss or weight gain? no CARDIAC:  Chest pain during exertion or walking? no PULMONARY:  Shortness of breath during exertion? yes EXTREMITIES:  Leg pain with walking? yes Wounds on the foot not healing? yes Pain in the feet that awakens you from sleep (rest pain)? yes NEUROLOGIC:  Weakness or numbness on one side of the body? no MISC:  Other organ systems reviewed, and are reported negative, except as noted in HPI.  Objective:   Physical Exam Constitutional:      Appearance: Normal appearance. He is morbidly obese. He is not toxic-appearing.  HENT:     Head: Normocephalic and atraumatic.  Eyes:     Extraocular  Movements: Extraocular movements intact.     Conjunctiva/sclera: Conjunctivae normal.  Cardiovascular:     Rate and Rhythm: Normal rate.  Musculoskeletal:     Right lower leg: Edema present.     Left lower leg: Edema present.  Pulmonary:     Effort: Pulmonary effort is normal.  Skin:    General: Skin is warm and dry.  Neurological:     General: No focal deficit present.     Mental Status: He is alert and oriented to person, place, and time.  Psychiatric:        Mood and Affect: Mood normal.        Behavior: Behavior normal.        Judgment: Judgment normal.        Assessment / Plan:     Assessment 1. Leg swelling   2. BMI 70 and over, adult (*)   3. Hypertension associated with type 2 diabetes mellitus  (*)   4. Venous stasis dermatitis of both lower extremities      Plan  Discussed the basic pathophysiology of veins and venous disease with patient today. Recommend wearing 20-87mmHg compression daily and will prescribe today. Encouraged elevating legs as able. Recommend regular walking. Also advised that weight was a contributing factor to his swelling. He has a prescription for ozempic but was afraid to start it due to possible side effects. Discussed this in detail with him today and advised starting  at a lower dose would help not have the side effects. He will start it to try. Will also place referral to bariatric medicine for patient as well as suspect he will need multifaceted approach to treatment of obesity. Will schedule for venous reflux study and call patient with results to discuss results and possible treatment options.  Follow up sooner as needed.    All new prescription medications and changes in current prescription dosages were discussed with the patient.  This includes medication name, use, dosage, patient education, potential side effects, drug interactions, consequences of not taking/using and special instructions.  Patient expressed understanding.  No  barriers to adherence.   I have reviewed the information contained in this note and personally verified its accuracy.  I obtained the history of present illness and personally performed the physical exam.

## 2023-11-25 ENCOUNTER — Emergency Department (HOSPITAL_COMMUNITY)

## 2023-11-25 ENCOUNTER — Other Ambulatory Visit: Payer: Self-pay

## 2023-11-25 ENCOUNTER — Inpatient Hospital Stay (HOSPITAL_COMMUNITY)
Admission: EM | Admit: 2023-11-25 | Discharge: 2023-11-27 | DRG: 728 | Disposition: A | Discharge status: Home / Self Care | Attending: Infectious Diseases | Admitting: Infectious Diseases

## 2023-11-25 ENCOUNTER — Encounter (HOSPITAL_COMMUNITY): Payer: Self-pay

## 2023-11-25 DIAGNOSIS — R269 Unspecified abnormalities of gait and mobility: Secondary | ICD-10-CM | POA: Diagnosis present

## 2023-11-25 DIAGNOSIS — E661 Drug-induced obesity: Secondary | ICD-10-CM | POA: Diagnosis present

## 2023-11-25 DIAGNOSIS — L0390 Cellulitis, unspecified: Secondary | ICD-10-CM | POA: Diagnosis present

## 2023-11-25 DIAGNOSIS — N492 Inflammatory disorders of scrotum: Secondary | ICD-10-CM | POA: Diagnosis not present

## 2023-11-25 DIAGNOSIS — Z79890 Hormone replacement therapy (postmenopausal): Secondary | ICD-10-CM

## 2023-11-25 DIAGNOSIS — I10 Essential (primary) hypertension: Secondary | ICD-10-CM | POA: Diagnosis present

## 2023-11-25 DIAGNOSIS — N50811 Right testicular pain: Principal | ICD-10-CM

## 2023-11-25 DIAGNOSIS — R296 Repeated falls: Secondary | ICD-10-CM | POA: Diagnosis present

## 2023-11-25 DIAGNOSIS — Z9981 Dependence on supplemental oxygen: Secondary | ICD-10-CM

## 2023-11-25 DIAGNOSIS — Z6841 Body Mass Index (BMI) 40.0 and over, adult: Secondary | ICD-10-CM

## 2023-11-25 DIAGNOSIS — Z79899 Other long term (current) drug therapy: Secondary | ICD-10-CM

## 2023-11-25 DIAGNOSIS — L97921 Non-pressure chronic ulcer of unspecified part of left lower leg limited to breakdown of skin: Secondary | ICD-10-CM

## 2023-11-25 DIAGNOSIS — N50812 Left testicular pain: Principal | ICD-10-CM

## 2023-11-25 DIAGNOSIS — G4733 Obstructive sleep apnea (adult) (pediatric): Secondary | ICD-10-CM

## 2023-11-25 DIAGNOSIS — I890 Lymphedema, not elsewhere classified: Secondary | ICD-10-CM | POA: Diagnosis not present

## 2023-11-25 DIAGNOSIS — L97929 Non-pressure chronic ulcer of unspecified part of left lower leg with unspecified severity: Secondary | ICD-10-CM | POA: Diagnosis present

## 2023-11-25 DIAGNOSIS — E662 Morbid (severe) obesity with alveolar hypoventilation: Secondary | ICD-10-CM | POA: Diagnosis present

## 2023-11-25 DIAGNOSIS — J9611 Chronic respiratory failure with hypoxia: Secondary | ICD-10-CM | POA: Diagnosis present

## 2023-11-25 DIAGNOSIS — E66813 Obesity, class 3: Secondary | ICD-10-CM | POA: Diagnosis present

## 2023-11-25 LAB — HIV ANTIBODY (ROUTINE TESTING W REFLEX): HIV Screen 4th Generation wRfx: NONREACTIVE

## 2023-11-25 LAB — URINALYSIS, ROUTINE W REFLEX MICROSCOPIC
Bilirubin Urine: NEGATIVE
Glucose, UA: NEGATIVE mg/dL
Hgb urine dipstick: NEGATIVE
Ketones, ur: NEGATIVE mg/dL
Leukocytes,Ua: NEGATIVE
Nitrite: NEGATIVE
Protein, ur: NEGATIVE mg/dL
Specific Gravity, Urine: 1.011 (ref 1.005–1.030)
pH: 5 (ref 5.0–8.0)

## 2023-11-25 LAB — CBC WITH DIFFERENTIAL/PLATELET
Abs Immature Granulocytes: 0.02 K/uL (ref 0.00–0.07)
Basophils Absolute: 0 K/uL (ref 0.0–0.1)
Basophils Relative: 0 %
Eosinophils Absolute: 0.2 K/uL (ref 0.0–0.5)
Eosinophils Relative: 3 %
HCT: 47.5 % (ref 39.0–52.0)
Hemoglobin: 14.9 g/dL (ref 13.0–17.0)
Immature Granulocytes: 0 %
Lymphocytes Relative: 17 %
Lymphs Abs: 1.2 K/uL (ref 0.7–4.0)
MCH: 26.9 pg (ref 26.0–34.0)
MCHC: 31.4 g/dL (ref 30.0–36.0)
MCV: 85.7 fL (ref 80.0–100.0)
Monocytes Absolute: 0.5 K/uL (ref 0.1–1.0)
Monocytes Relative: 7 %
Neutro Abs: 5.1 K/uL (ref 1.7–7.7)
Neutrophils Relative %: 73 %
Platelets: 192 K/uL (ref 150–400)
RBC: 5.54 MIL/uL (ref 4.22–5.81)
RDW: 16.2 % — ABNORMAL HIGH (ref 11.5–15.5)
WBC: 7.1 K/uL (ref 4.0–10.5)
nRBC: 0 % (ref 0.0–0.2)

## 2023-11-25 LAB — COMPREHENSIVE METABOLIC PANEL WITH GFR
ALT: 20 U/L (ref 0–44)
AST: 21 U/L (ref 15–41)
Albumin: 3 g/dL — ABNORMAL LOW (ref 3.5–5.0)
Alkaline Phosphatase: 58 U/L (ref 38–126)
Anion gap: 13 (ref 5–15)
BUN: 7 mg/dL (ref 6–20)
CO2: 27 mmol/L (ref 22–32)
Calcium: 8.4 mg/dL — ABNORMAL LOW (ref 8.9–10.3)
Chloride: 98 mmol/L (ref 98–111)
Creatinine, Ser: 0.79 mg/dL (ref 0.61–1.24)
GFR, Estimated: >60 mL/min (ref >60)
Glucose, Bld: 105 mg/dL — ABNORMAL HIGH (ref 70–99)
Potassium: 4.3 mmol/L (ref 3.5–5.1)
Sodium: 138 mmol/L (ref 135–145)
Total Bilirubin: 0.8 mg/dL (ref 0.0–1.2)
Total Protein: 6.6 g/dL (ref 6.5–8.1)

## 2023-11-25 LAB — BRAIN NATRIURETIC PEPTIDE: B Natriuretic Peptide: 35.1 pg/mL (ref 0.0–100.0)

## 2023-11-25 LAB — LIPASE, BLOOD: Lipase: 33 U/L (ref 11–51)

## 2023-11-25 MED ORDER — VANCOMYCIN HCL 10 G IV SOLR
2500.0000 mg | Freq: Once | INTRAVENOUS | Status: AC
  Administered 2023-11-25: 2500 mg via INTRAVENOUS
  Filled 2023-11-25: qty 2500

## 2023-11-25 MED ORDER — FUROSEMIDE 20 MG PO TABS
20.0000 mg | ORAL_TABLET | Freq: Every day | ORAL | Status: DC
  Administered 2023-11-26 – 2023-11-27 (×2): 20 mg via ORAL
  Filled 2023-11-25 (×2): qty 1

## 2023-11-25 MED ORDER — METRONIDAZOLE 500 MG/100ML IV SOLN
500.0000 mg | Freq: Two times a day (BID) | INTRAVENOUS | Status: DC
  Administered 2023-11-25 – 2023-11-27 (×4): 500 mg via INTRAVENOUS
  Filled 2023-11-25 (×4): qty 100

## 2023-11-25 MED ORDER — SODIUM CHLORIDE 0.9 % IV SOLN
2.0000 g | Freq: Once | INTRAVENOUS | Status: AC
  Administered 2023-11-25: 2 g via INTRAVENOUS
  Filled 2023-11-25: qty 12.5

## 2023-11-25 MED ORDER — AMLODIPINE BESYLATE 10 MG PO TABS
10.0000 mg | ORAL_TABLET | Freq: Every day | ORAL | Status: DC
  Administered 2023-11-26 – 2023-11-27 (×2): 10 mg via ORAL
  Filled 2023-11-25 (×2): qty 1

## 2023-11-25 MED ORDER — METRONIDAZOLE 500 MG/100ML IV SOLN
500.0000 mg | Freq: Once | INTRAVENOUS | Status: AC
  Administered 2023-11-25: 500 mg via INTRAVENOUS
  Filled 2023-11-25: qty 100

## 2023-11-25 MED ORDER — ACETAMINOPHEN 500 MG PO TABS
1000.0000 mg | ORAL_TABLET | Freq: Four times a day (QID) | ORAL | Status: DC
  Administered 2023-11-25 – 2023-11-27 (×6): 1000 mg via ORAL
  Filled 2023-11-25 (×7): qty 2

## 2023-11-25 MED ORDER — TOPIRAMATE 25 MG PO TABS
50.0000 mg | ORAL_TABLET | Freq: Every day | ORAL | Status: DC
  Administered 2023-11-26 – 2023-11-27 (×2): 50 mg via ORAL
  Filled 2023-11-25 (×2): qty 2

## 2023-11-25 MED ORDER — FUROSEMIDE 10 MG/ML IJ SOLN
40.0000 mg | Freq: Once | INTRAMUSCULAR | Status: AC
  Administered 2023-11-25: 40 mg via INTRAVENOUS
  Filled 2023-11-25: qty 4

## 2023-11-25 MED ORDER — OXYCODONE HCL 5 MG PO TABS
5.0000 mg | ORAL_TABLET | ORAL | Status: AC | PRN
  Administered 2023-11-25 – 2023-11-26 (×2): 5 mg via ORAL
  Filled 2023-11-25 (×2): qty 1

## 2023-11-25 MED ORDER — ENOXAPARIN SODIUM 120 MG/0.8ML IJ SOSY
110.0000 mg | PREFILLED_SYRINGE | INTRAMUSCULAR | Status: DC
  Administered 2023-11-26 – 2023-11-27 (×2): 110 mg via SUBCUTANEOUS
  Filled 2023-11-25 (×2): qty 0.74

## 2023-11-25 MED ORDER — VANCOMYCIN HCL 2000 MG/400ML IV SOLN
2000.0000 mg | Freq: Three times a day (TID) | INTRAVENOUS | Status: DC
  Filled 2023-11-25: qty 400

## 2023-11-25 MED ORDER — VANCOMYCIN HCL 1750 MG/350ML IV SOLN
1750.0000 mg | Freq: Three times a day (TID) | INTRAVENOUS | Status: DC
  Administered 2023-11-26 (×2): 1750 mg via INTRAVENOUS
  Filled 2023-11-25 (×2): qty 350

## 2023-11-25 MED ORDER — HYDROCHLOROTHIAZIDE 25 MG PO TABS
25.0000 mg | ORAL_TABLET | Freq: Every day | ORAL | Status: DC
  Administered 2023-11-26 – 2023-11-27 (×2): 25 mg via ORAL
  Filled 2023-11-25 (×2): qty 1

## 2023-11-25 MED ORDER — LISINOPRIL-HYDROCHLOROTHIAZIDE 20-25 MG PO TABS: 1.0000 | ORAL_TABLET | Freq: Every day | ORAL | Status: DC

## 2023-11-25 MED ORDER — LISINOPRIL 20 MG PO TABS
20.0000 mg | ORAL_TABLET | Freq: Every day | ORAL | Status: DC
  Administered 2023-11-26 – 2023-11-27 (×2): 20 mg via ORAL
  Filled 2023-11-25 (×2): qty 1

## 2023-11-25 MED ORDER — SODIUM CHLORIDE 0.9 % IV SOLN
2.0000 g | Freq: Three times a day (TID) | INTRAVENOUS | Status: DC
  Administered 2023-11-25 – 2023-11-27 (×5): 2 g via INTRAVENOUS
  Filled 2023-11-25 (×5): qty 12.5

## 2023-11-25 MED ORDER — LEVOTHYROXINE SODIUM 50 MCG PO TABS
50.0000 ug | ORAL_TABLET | Freq: Every day | ORAL | Status: DC
Start: 2023-11-26 — End: 2023-11-27
  Administered 2023-11-26 – 2023-11-27 (×2): 50 ug via ORAL
  Filled 2023-11-25 (×2): qty 1

## 2023-11-25 NOTE — Progress Notes (Incomplete)
 Internal Medicine Teaching Service Attending Note Date: 11/25/2023  Patient name: [name]  Medical record number: [id number]  Date of birth: 13-Sep-1975   I have seen and evaluated [name] and discussed their care with the Residency Team.   48 yo M with hx of morbid obesity, chronic LE edema (weeping and LLE wound), who roughly 1 week ago scratched his scrotum while getting into his car.  He has since had worsening pain and swelling. He had subjective fever but did not measure.   He has had prev adm for LE cellulitis Being worked up for gastric bypass surgery.   Physical Exam: Blood pressure (!) 147/88, pulse 71, temperature 98.6 F (37 C), temperature source Oral, resp. rate (!) 23, height 5' 10 (1.778 m), weight (!) 225.9 kg, SpO2 95%. General appearance: alert, cooperative, no distress, and morbidly obese Eyes: negative findings: conjunctivae and sclerae normal and pupils equal, round, reactive to light and accomodation Throat: normal findings: oropharynx pink & moist without lesions or evidence of thrush Neck: no adenopathy, supple, symmetrical, trachea midline, and +JVP Resp: clear to auscultation bilaterally Cardio: regular rate and rhythm GI: normal findings: bowel sounds normal and soft, non-tender Male genitalia: scrotum grossly swollen with penis retracted. Erythematous and tender. There is no fluctuance. No increase in heat.  Extremities: edema massive, erythema, non-tender, wound on L lateral leg (superficial, crusted blood, no d/c).   Lab results: Results for orders placed or performed during the hospital encounter of 11/25/23 (from the past 24 hours)  CBC with Differential     Status: Abnormal   Collection Time: 11/25/23 12:49 PM  Result Value Ref Range   WBC 7.1 4.0 - 10.5 K/uL   RBC 5.54 4.22 - 5.81 MIL/uL   Hemoglobin 14.9 13.0 - 17.0 g/dL   HCT 52.4 60.9 - 47.9 %   MCV 85.7 80.0 - 100.0 fL   MCH 26.9 26.0 - 34.0 pg   MCHC 31.4 30.0 - 36.0 g/dL   RDW  83.7 (H) 88.4 - 15.5 %   Platelets 192 150 - 400 K/uL   nRBC 0.0 0.0 - 0.2 %   Neutrophils Relative % 73 %   Neutro Abs 5.1 1.7 - 7.7 K/uL   Lymphocytes Relative 17 %   Lymphs Abs 1.2 0.7 - 4.0 K/uL   Monocytes Relative 7 %   Monocytes Absolute 0.5 0.1 - 1.0 K/uL   Eosinophils Relative 3 %   Eosinophils Absolute 0.2 0.0 - 0.5 K/uL   Basophils Relative 0 %   Basophils Absolute 0.0 0.0 - 0.1 K/uL   Immature Granulocytes 0 %   Abs Immature Granulocytes 0.02 0.00 - 0.07 K/uL  Comprehensive metabolic panel     Status: Abnormal   Collection Time: 11/25/23 12:49 PM  Result Value Ref Range   Sodium 138 135 - 145 mmol/L   Potassium 4.3 3.5 - 5.1 mmol/L   Chloride 98 98 - 111 mmol/L   CO2 27 22 - 32 mmol/L   Glucose, Bld 105 (H) 70 - 99 mg/dL   BUN 7 6 - 20 mg/dL   Creatinine, Ser 9.20 0.61 - 1.24 mg/dL   Calcium 8.4 (L) 8.9 - 10.3 mg/dL   Total Protein 6.6 6.5 - 8.1 g/dL   Albumin 3.0 (L) 3.5 - 5.0 g/dL   AST 21 15 - 41 U/L   ALT 20 0 - 44 U/L   Alkaline Phosphatase 58 38 - 126 U/L   Total Bilirubin 0.8 0.0 - 1.2 mg/dL  GFR, Estimated >60 >60 mL/min   Anion gap 13 5 - 15  Lipase, blood     Status: None   Collection Time: 11/25/23 12:49 PM  Result Value Ref Range   Lipase 33 11 - 51 U/L  Brain natriuretic peptide     Status: None   Collection Time: 11/25/23 12:49 PM  Result Value Ref Range   B Natriuretic Peptide 35.1 0.0 - 100.0 pg/mL  Urinalysis, Routine w reflex microscopic -Urine, Clean Catch     Status: None   Collection Time: 11/25/23  2:33 PM  Result Value Ref Range   Color, Urine YELLOW YELLOW   APPearance CLEAR CLEAR   Specific Gravity, Urine 1.011 1.005 - 1.030   pH 5.0 5.0 - 8.0   Glucose, UA NEGATIVE NEGATIVE mg/dL   Hgb urine dipstick NEGATIVE NEGATIVE   Bilirubin Urine NEGATIVE NEGATIVE   Ketones, ur NEGATIVE NEGATIVE mg/dL   Protein, ur NEGATIVE NEGATIVE mg/dL   Nitrite NEGATIVE NEGATIVE   Leukocytes,Ua NEGATIVE NEGATIVE    Imaging results:  US  SCROTUM  W/DOPPLER Result Date: 11/25/2023 CLINICAL DATA:  testicular pain ?ischemia EXAM: SCROTAL ULTRASOUND DOPPLER ULTRASOUND OF THE TESTICLES TECHNIQUE: Complete ultrasound examination of the testicles, epididymis, and other scrotal structures was performed. Color and spectral Doppler ultrasound were also utilized to evaluate blood flow to the testicles. COMPARISON:  05/01/2010 FINDINGS: Right testicle Measurements: 6 x 2.9 x 2.8 cm. No mass or microlithiasis visualized. Left testicle Measurements: 5.2 x 2.5 x 2.4 cm. No mass or microlithiasis visualized. Right epididymis:  Normal in size and appearance. Left epididymis:  Normal in size and appearance. Hydrocele:  Small bilateral hydroceles. Varicocele:  None visualized. Pulsed Doppler interrogation of both testes demonstrates normal low resistance arterial and venous waveforms bilaterally. Severe scrotal wall thickening and subcutaneous edema. IMPRESSION: 1. Severe scrotal wall thickening with subcutaneous edema, which may be due to dependent edema or scrotal cellulitis. 2. Small bilateral hydroceles. 3. No testicular mass, findings of epididymo-orchitis, or changes of testicular torsion, at this time. Electronically Signed   By: Rogelia Myers M.D.   On: 11/25/2023 14:16   DG Chest Portable 1 View Result Date: 11/25/2023 CLINICAL DATA:  fluid overload EXAM: PORTABLE CHEST - 1 VIEW COMPARISON:  06/20/2022 FINDINGS: Diffuse bilateral perihilar interstitial opacities. No focal airspace consolidation, pleural effusion, or pneumothorax. Mild cardiomegaly.No acute fracture or destructive lesion. IMPRESSION: Mild cardiomegaly with findings of interstitial edema. Electronically Signed   By: Rogelia Myers M.D.   On: 11/25/2023 13:28    Assessment and Plan: I agree with the formulated Assessment and Plan with the following changes:  Scrotal cellulitis Morbid obesity  Will treat [name] broadly with anbx and including to cover pseudomonas.  F/u BCx Follow closely as  there is suspicion to progress to fournier's.  Continue to encourage wt loss.    Eben Reyes BROCKS, MD

## 2023-11-25 NOTE — Progress Notes (Signed)
 Pt states he wears BiPAP at night and is not ready to wear it at this time, will call if he needs it.

## 2023-11-25 NOTE — ED Notes (Signed)
US at beside

## 2023-11-25 NOTE — ED Notes (Signed)
 X-ray at bedside.

## 2023-11-25 NOTE — Hospital Course (Addendum)
 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 *  Consults:***  Procedures:***  Follow-up items:***

## 2023-11-25 NOTE — Progress Notes (Addendum)
 Pharmacy Antibiotic Note  [name] is a 48 y.o. male for which pharmacy has been consulted for cefepime  and vancomycin  dosing for cellulitis.  Patient with a history of morbid obesity, HTN, and OSA . Patient presenting with scrotal pain.  SCr 0.79 WBC 7.1; T 97.8; HR 71; RR 23  Plan: Metronidazole  per MD Cefepime  2g q8hr  Vancomycin  2500 mg once then 1750mg  q8hr (eAUC 454.7; Scr rounded to 0.8; using IBW and Vd 0.5) unless change in renal function Monitor WBC, fever, renal function, cultures De-escalate when able Levels at steady state  Height: 5' 10 (177.8 cm) Weight: (!) 225.9 kg (498 lb) IBW/kg (Calculated) : 73  Temp (24hrs), Avg:98.2 F (36.8 C), Min:97.8 F (36.6 C), Max:98.6 F (37 C)  Recent Labs  Lab 11/25/23 1249  WBC 7.1  CREATININE 0.79    Estimated Creatinine Clearance: 216.7 mL/min (by C-G formula based on SCr of 0.79 mg/dL).    Allergies  Allergen Reactions   Penicillins Hives   Dilaudid [Hydromorphone Hcl] Itching   Microbiology results: Pending  Thank you for allowing pharmacy to be a part of this patient's care.  Dorn Buttner, PharmD, BCPS 11/25/2023 5:21 PM ED Clinical Pharmacist -  [phone]

## 2023-11-25 NOTE — ED Triage Notes (Signed)
 Pt BIB [facility] from home d/t testicular swelling that has progressed in pain & size throughout the last week. Denies any recent trauma or d/c noted, urine is yellow but a little dark. Afebrile, 178/107 (Hx of HTN), 95% on 3L O2 via n/c (3L home O2 at baseline), 80 bpm, CBG 93. A/Ox4, rates pain 10/10 then given 100 mcg Fent while en route, pain then 5/10.

## 2023-11-25 NOTE — H&P (Signed)
 Date: 11/25/2023               Patient Name:  [name] MRN: [id number]  DOB: Jan 25, 1976 Age / Sex: 48 y.o., male   PCP: Samie Frederick, PA-C         Medical Service: Internal Medicine Teaching Service         Attending Physician: Dr. Reyes Fenton      First Contact: Schuyler Novak, DO}    Second Contact: Dr. Ozell Kung, MD          Pager Information: First Contact Pager: [phone]   Second Contact Pager: [phone]   SUBJECTIVE   Chief Complaint: Scrotal Pain  History of Present Illness: [name] is a 48 y.o. male with PMH of morbid obesity, HTN, and OSA who presented with a 2 week history of scrotal pain. He reports that he hit his scrotum against the seat in his car and had since noticed swelling and increasing pain. He denies noticing any open wounds or drainage, primarily complaining of pain. He is still urinating normally without dysuria or hematuria. He does have a history of lymphedema in the bilateral lower extremities, but reports that he does not have chronic swelling of his scrotum. He does note an open wound in the LLE that is chronically weeping. He admits to subjective fever and chills, but denies CP, SOB, Abd pain, and N/V   ED Course: Labs significant for CBC WNL, CMP unremarkable, UA unremarkable Imaging US  Scrotum with doppler--severe scrotal wall thickening with subcutaneous edema, which may be due to dependent edema or scrotal cellulitis.  Received Lasix  40mg  Consulted Vanc, Flagyl , cefepime   Meds:  Patient reported:   Current Meds  Medication Sig   albuterol  (PROAIR  HFA) 108 (90 Base) MCG/ACT inhaler Inhale 2 puffs into the lungs every 4 (four) hours as needed for wheezing.   amLODipine  (NORVASC ) 10 MG tablet Take 1 tablet (10 mg total) by mouth daily.   furosemide  (LASIX ) 20 MG tablet Take 20 mg by mouth daily.   levothyroxine  (SYNTHROID ) 50 MCG tablet Take 50 mcg by mouth daily before breakfast.   lisinopril -hydrochlorothiazide   (PRINZIDE ,ZESTORETIC ) 20-25 MG per tablet Take 1 tablet by mouth daily.   topiramate  (TOPAMAX ) 50 MG tablet Take 50 mg by mouth daily.    Past Medical History Lymphedema OSA Hypertension Morbid obesity  Past Surgical History History reviewed. No pertinent surgical history.   Social:  Lives With: Family Level of Function: Independent PCP:  Samie Frederick, PA-C  Substances: -Tobacco: No -Alcohol: Yes -Recreational Drug: None  Family History:  Family History  Problem Relation Age of Onset   Pulmonary fibrosis Father      Allergies: Allergies as of 11/25/2023 - Review Complete 11/25/2023  Allergen Reaction Noted   Penicillins Hives 08/27/2011   Dilaudid [hydromorphone hcl] Itching 08/27/2011    Review of Systems: A complete ROS was negative except as per HPI.   OBJECTIVE:   Physical Exam: Blood pressure (!) 147/88, pulse 71, temperature 97.8 F (36.6 C), temperature source Oral, resp. rate (!) 23, height 5' 10 (1.778 m), weight (!) 225.9 kg, SpO2 95%.  Constitutional: well-appearing morbidly obese male, in no acute distress HENT: normocephalic atraumatic, mucous membranes moist Eyes: conjunctiva non-erythematous Neck: supple Cardiovascular: regular rate and rhythm, no m/r/g Pulmonary/Chest: normal work of breathing on 2 L nasal cannula, bilateral breath sounds diminished abdominal: soft, non-tender, non-distended MSK: normal bulk and tone Skin: warm and dry GU: Scrotum edematous bilaterally without open wounds or drainage.  It  is erythematous however not warm to touch. There is noted urinary incontinence on exam.  Labs: CBC    Component Value Date/Time   WBC 7.1 11/25/2023 1249   RBC 5.54 11/25/2023 1249   HGB 14.9 11/25/2023 1249   HCT 47.5 11/25/2023 1249   PLT 192 11/25/2023 1249   MCV 85.7 11/25/2023 1249   MCH 26.9 11/25/2023 1249   MCHC 31.4 11/25/2023 1249   RDW 16.2 (H) 11/25/2023 1249   LYMPHSABS 1.2 11/25/2023 1249   MONOABS 0.5 11/25/2023 1249    EOSABS 0.2 11/25/2023 1249   BASOSABS 0.0 11/25/2023 1249     CMP     Component Value Date/Time   NA 138 11/25/2023 1249   K 4.3 11/25/2023 1249   CL 98 11/25/2023 1249   CO2 27 11/25/2023 1249   GLUCOSE 105 (H) 11/25/2023 1249   BUN 7 11/25/2023 1249   CREATININE 0.79 11/25/2023 1249   CALCIUM 8.4 (L) 11/25/2023 1249   PROT 6.6 11/25/2023 1249   ALBUMIN 3.0 (L) 11/25/2023 1249   AST 21 11/25/2023 1249   ALT 20 11/25/2023 1249   ALKPHOS 58 11/25/2023 1249   BILITOT 0.8 11/25/2023 1249   GFRNONAA >60 11/25/2023 1249   GFRAA 64 (L) 05/20/2013 1623    Imaging:  US  SCROTUM W/DOPPLER Result Date: 11/25/2023 CLINICAL DATA:  testicular pain ?ischemia EXAM: SCROTAL ULTRASOUND DOPPLER ULTRASOUND OF THE TESTICLES TECHNIQUE: Complete ultrasound examination of the testicles, epididymis, and other scrotal structures was performed. Color and spectral Doppler ultrasound were also utilized to evaluate blood flow to the testicles. COMPARISON:  05/01/2010 FINDINGS: Right testicle Measurements: 6 x 2.9 x 2.8 cm. No mass or microlithiasis visualized. Left testicle Measurements: 5.2 x 2.5 x 2.4 cm. No mass or microlithiasis visualized. Right epididymis:  Normal in size and appearance. Left epididymis:  Normal in size and appearance. Hydrocele:  Small bilateral hydroceles. Varicocele:  None visualized. Pulsed Doppler interrogation of both testes demonstrates normal low resistance arterial and venous waveforms bilaterally. Severe scrotal wall thickening and subcutaneous edema. IMPRESSION: 1. Severe scrotal wall thickening with subcutaneous edema, which may be due to dependent edema or scrotal cellulitis. 2. Small bilateral hydroceles. 3. No testicular mass, findings of epididymo-orchitis, or changes of testicular torsion, at this time. Electronically Signed   By: Rogelia Myers M.D.   On: 11/25/2023 14:16   DG Chest Portable 1 View Result Date: 11/25/2023 CLINICAL DATA:  fluid overload EXAM: PORTABLE  CHEST - 1 VIEW COMPARISON:  06/20/2022 FINDINGS: Diffuse bilateral perihilar interstitial opacities. No focal airspace consolidation, pleural effusion, or pneumothorax. Mild cardiomegaly.No acute fracture or destructive lesion. IMPRESSION: Mild cardiomegaly with findings of interstitial edema. Electronically Signed   By: Rogelia Myers M.D.   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 person living with a history of OSA, hypertension, morbid obesity and lymphedema who presented with scrotal pain and admitted for scrotal cellulitis on hospital day 0  Scrotal cellulitis -The patient presented to the ED after a 1 to 2-week history of scrotal swelling and pain.  He denies any open wounds bleeding or drainage but endorses significant pain. -The patient is morbidly  obese with a BMI of 71.46 and on exam had a large pannus covering scrotum.  Suspect this is contributing to development of cellulitis due to incontinence of urine and poor hygiene. -On exam scrotum is largely edematous without open wounds or drainage.  It is erythematous but not warm to touch. -Will monitor patient closely for development of Fournier's gangrene -Continue Vanc, cefepime , and Flagyl   Lymphedema Chronic open wounds -The patient reports a history of chronic lymphedema.  On exam he does have significant edema with erythema of the bilateral lower extremities below the knees. -Has reported open wound on the left shin that will spontaneously open and close.  It does occasionally have bloody drainage. - He has followed with wound clinic in the past however does not currently see them -Will consult wound care for  recommendations.  Obstructive sleep apnea Chronic hypoxic respiratory failure -The patient reports a history of obstructive sleep apnea and needing 2 L nasal cannula chronically at home. -He is compliant with his CPAP however he does not wear his oxygen at all times feeling as if he does not need it when at rest.  Suspect there is a component of obesity hypoventilation syndrome. -While in the room we did wean down his oxygen and patient maintained saturations in the 90s. -Continue CPAP -Continue nasal cannula as needed.  Morbid obesity Gait disturbances -The patient does have a BMI of 71.46 -Suspect this is contributing to multiple of his problems including his acute scrotal cellulitis, lymphedema, and obstructive sleep apnea. -The patient does report multiple falls over the past few months noting that his balance is getting worse. -Consult PT/OT  Best practice: Diet: Normal VTE: Enoxaparin  IVF: None,None Code: Full  Disposition planning: Prior to Admission Living Arrangement: Home, living with family Anticipated Discharge Location: Home  Dispo: Admit patient to Observation with expected length of stay less than 2 midnights.  Signed: Myrna Bitters, DO Internal Medicine Resident  11/25/2023, 4:59 PM  On Call pager: [phone]

## 2023-11-25 NOTE — ED Provider Notes (Addendum)
 Patient signed out to me from [place], [place].  Here for testicular swelling started a week ago.  Workup is suggestive of scrotal cellulitis without sepsis.  Plan is to admit to the hospital on IV antibiotics. Physical Exam  BP (!) 141/84   Pulse 74   Temp 98.6 F (37 C) (Oral)   Resp (!) 24   Ht 5' 10 (1.778 m)   Wt (!) 225.9 kg   SpO2 96%   BMI 71.46 kg/m   Physical Exam  Procedures  Procedures  ED Course / MDM   Clinical Course as of 11/25/23 1604  Wed Nov 25, 2023  1525 Admitting for scrotal cellulitis.  [JR]    Clinical Course User Index [JR] Lang Norleen POUR, PA-C   Medical Decision Making Amount and/or Complexity of Data Reviewed Labs: ordered. Radiology: ordered.  Risk Prescription drug management. Decision regarding hospitalization.   Admitted to medical unassigned team with Dr. Reyes Fenton.  At this time he is well-appearing no acute distress and hemodynamically stable. IV cefepime , Flagyl  and vancomycin , ordered.       Lang Norleen POUR, PA-C 11/25/23 1607    Tallyn Holroyd K, PA-C 11/25/23 1640    Lowther, Amy, DO 11/25/23 1645

## 2023-11-25 NOTE — ED Provider Notes (Signed)
 [facility] EMERGENCY DEPARTMENT AT [facility] Provider Note   CSN: [id number] Arrival date & time: 11/25/23  1228     Patient presents with: Testicle Pain   [name] is a 48 y.o. male.   Notable area 48 y.o male with a PMH of HTN, fluid retention presents to the ED with a chief complaint of testicular swelling x 1 week. Patient reports more swelling to bilateral testicles over the last week, now is having pain bilaterally due to the pressure. He does take a fluid.According to records he takes 20 mg of lasix  daily.  Patient reports taking Tylenol  to help with his pain for this.  He also reports a 5 pound weight gain over the past 4 days.  Patient wears oxygen at baseline of 3 L due to underlying sleep apnea. No increase in his oxygen requirement. No chest pain, no shortness of breath, leg swelling at baseline.     The history is provided by the patient and the EMS personnel.  Testicle Pain This is a new problem. The current episode started more than 1 week ago. The problem occurs constantly. The problem has been gradually worsening. Pertinent negatives include no chest pain, no abdominal pain and no shortness of breath. The symptoms are aggravated by walking. Nothing relieves the symptoms. He has tried acetaminophen  for the symptoms.       Prior to Admission medications   Medication Sig Start Date End Date Taking? Authorizing Provider  albuterol  (PROAIR  HFA) 108 (90 Base) MCG/ACT inhaler Inhale 2 puffs into the lungs every 4 (four) hours as needed for wheezing. 06/05/16  Yes [provider]  amLODipine  (NORVASC ) 10 MG tablet Take 1 tablet (10 mg total) by mouth daily. 06/26/22  Yes Will Almarie MATSU, MD  furosemide  (LASIX ) 20 MG tablet Take 20 mg by mouth daily. 09/13/21  Yes [provider]  levothyroxine  (SYNTHROID ) 50 MCG tablet Take 50 mcg by mouth daily before breakfast. 09/13/21  Yes [provider]  lisinopril -hydrochlorothiazide   (PRINZIDE ,ZESTORETIC ) 20-25 MG per tablet Take 1 tablet by mouth daily.   Yes [provider]  topiramate  (TOPAMAX ) 50 MG tablet Take 50 mg by mouth daily. 10/23/20  Yes [provider]  acetaminophen  (TYLENOL ) 500 MG tablet Take 2 tablets (1,000 mg total) by mouth every 6 (six) hours for 7 days. 11/27/23 12/04/23  King, Olivia, DO  ibuprofen  (ADVIL ) 600 MG tablet Take 1 tablet (600 mg total) by mouth every 8 (eight) hours as needed for moderate pain (pain score 4-6). 11/27/23   King, Olivia, DO  levofloxacin  (LEVAQUIN ) 500 MG tablet Take 1 tablet (500 mg total) by mouth daily for 7 days. 11/27/23 12/04/23  Myrna Bitters, DO    Allergies: Penicillins and Dilaudid [hydromorphone hcl]    Review of Systems  Constitutional:  Negative for chills and fever.  Respiratory:  Negative for shortness of breath.   Cardiovascular:  Negative for chest pain.  Gastrointestinal:  Negative for abdominal pain.  Genitourinary:  Positive for testicular pain.  All other systems reviewed and are negative.   Updated Vital Signs BP (!) 156/86 (BP Location: Left Wrist)   Pulse 87   Temp 98.3 F (36.8 C) (Oral)   Resp 18   Ht 5' 10 (1.778 m)   Wt (!) 225.9 kg   SpO2 90%   BMI 71.46 kg/m   Physical Exam Vitals and nursing note reviewed. Exam conducted with a chaperone present.  Constitutional:      Appearance: Normal appearance.  HENT:     Head: Normocephalic and atraumatic.     Mouth/Throat:     Mouth: Mucous membranes are moist.  Cardiovascular:     Rate and Rhythm: Normal rate.  Pulmonary:     Effort: Pulmonary effort is normal.     Breath sounds: No wheezing.     Comments: Difficult to auscultate due to body habitus, no obvious wheezing.  Abdominal:     General: There is distension.  Genitourinary:    Testes:        Right: Tenderness and swelling present.        Left: Tenderness and swelling present.       Comments: Bilaterally swelling to the testicles, no changes in the skin,  no erythema but significant edema.  Musculoskeletal:     Cervical back: Normal range of motion and neck supple.  Skin:    General: Skin is warm and dry.  Neurological:     Mental Status: He is alert and oriented to person, place, and time.     (all labs ordered are listed, but only abnormal results are displayed) Labs Reviewed  CBC WITH DIFFERENTIAL/PLATELET - Abnormal; Notable for the following components:      Result Value   RDW 16.2 (*)    All other components within normal limits  COMPREHENSIVE METABOLIC PANEL WITH GFR - Abnormal; Notable for the following components:   Glucose, Bld 105 (*)    Calcium 8.4 (*)    Albumin 3.0 (*)    All other components within normal limits  CBC - Abnormal; Notable for the following components:   RDW 16.6 (*)    All other components within normal limits  CBC - Abnormal; Notable for the following components:   RDW 16.4 (*)    All other components within normal limits  LIPASE, BLOOD  BRAIN NATRIURETIC PEPTIDE  URINALYSIS, ROUTINE W REFLEX MICROSCOPIC  HIV ANTIBODY (ROUTINE TESTING W REFLEX)    EKG: None  Radiology: No results found.    Procedures   Medications Ordered in the ED  oxyCODONE  (Oxy IR/ROXICODONE ) immediate release tablet 5 mg (5 mg Oral Given 11/26/23 0553)  furosemide  (LASIX ) injection 40 mg (40 mg Intravenous Given 11/25/23 1423)  vancomycin  (VANCOCIN ) 2,500 mg in sodium chloride  0.9 % 500 mL IVPB (0 mg Intravenous Stopped 11/25/23 1900)  ceFEPIme  (MAXIPIME ) 2 g in sodium chloride  0.9 % 100 mL IVPB (0 g Intravenous Stopped 11/25/23 1605)  metroNIDAZOLE  (FLAGYL ) IVPB 500 mg (0 mg Intravenous Stopped 11/25/23 1632)  Oritavancin  Diphosphate (ORBACTIV ) 1,200 mg in dextrose  5 % IVPB (1,200 mg Intravenous New Bag/Given 11/27/23 1320)  vancomycin  (VANCOREADY) IVPB 1750 mg/350 mL (1,750 mg Intravenous New Bag/Given 11/27/23 0053)    Clinical Course as of 12/02/23 2307  Wed Nov 25, 2023  1525 Admitting for scrotal cellulitis.   [JR]    Clinical Course User Index [JR] Lang Norleen POUR, PA-C                                 Medical Decision Making Amount and/or Complexity of Data Reviewed Labs: ordered. Radiology: ordered.  Risk Prescription drug management. Decision regarding hospitalization.   This patient presents to the ED for concern of testicular swelling, this involves a number of treatment options, and is a complaint that carries with it a high risk of complications and morbidity.  The differential diagnosis includes fluid overload, infection versus torsion.    Co morbidities: Discussed in  HPI   Brief History:  See HPI.   EMR reviewed including pt PMHx, past surgical history and past visits to ER.   See HPI for more details   Lab Tests:  I ordered and independently interpreted labs.  The pertinent results include:    CBC with no leukocytosis.   Imaging Studies:  Xray of the chest showed: Mild cardiomegaly with findings of interstitial edema.   US  scrotum showed: 1. Severe scrotal wall thickening with subcutaneous edema, which may  be due to dependent edema or scrotal cellulitis.  2. Small bilateral hydroceles.  3. No testicular mass, findings of epididymo-orchitis, or changes of  testicular torsion, at this time.   Cardiac Monitoring:  N/A  Medicines ordered:  I ordered medication including lasix   for diuresis  Reevaluation of the patient after these medicines showed that the patient stayed the same I have reviewed the patients home medicines and have made adjustments as needed  Reevaluation:  After the interventions noted above I re-evaluated patient and found that they have :stayed the same   Social Determinants of Health:  The patient's social determinants of health were a factor in the care of this patient   Problem List / ED Course:  Patient presented to the ED with a chief complaint of 1 week of scrotal swelling and testicular pain.  The symptoms have  worsened over the past 3 days, noticed a weight increase of 5 pounds over the past 3 days.  He is on a fluid pill Lasix  20 mg daily, which she reports he has been taking.  He does wear oxygen at baseline 3 L, there has not been any increase in his oxygen requirement, he is not here with any shortness of breath or chest pain. On physical exam, he does have remarkable erythema, edema to the scrotal area, he does report increase in redness over the past 3 days.  Chaperoned by NT along with Dr. Dreama edema along with edema noted.  Skin changes consistent with cellulitis. Ultrasound does not show torsion, not concern for any necrotizing infection at this time.  Was started on Flagyl , cefepime , bank per pharmacy recommendations.   Dispostion:  After consideration of the diagnostic results and the patients response to treatment, I feel that the patent would benefit from admission.     Portions of this note were generated with Scientist, clinical (histocompatibility and immunogenetics). Dictation errors may occur despite best attempts at proofreading.   Final diagnoses:  Pain in both testicles  Cellulitis of scrotum  Ulcer of left lower extremity, limited to breakdown of skin [facility])  Lymphedema    ED Discharge Orders          Ordered    acetaminophen  (TYLENOL ) 500 MG tablet  Every 6 hours        11/27/23 0907    ibuprofen  (ADVIL ) 600 MG tablet  Every 8 hours PRN        11/27/23 0907    levofloxacin  (LEVAQUIN ) 250 MG tablet  Daily,   Status:  Discontinued        11/27/23 0907    AMB referral to wound care center       Comments: [facility] [address] [place], [place]  [phone] (Upper extremities)   [address] [place], [place] [phone] (Lower extremities, PATIENT CAN NOT HAVE A WOUND)   [facility] [address] [place], [place] [address] [phone]   [facility] [address] Medical  Office Building [address]  [place], [place] [phone]  [facility] [address] [place], [place] [address] [phone]   [facility]  (only treatment for lymphedema related to cancer diagnosis) [address]  [place], [place] [address] [phone]     [facility] [address] [place], [place] [address] [phone] [facility] ([facility]) [phone] [place], [place] [address] [phone]   11/27/23 0913    levofloxacin  (LEVAQUIN ) 500 MG tablet  Daily        11/27/23 1030    Increase activity slowly        11/27/23 1347    Diet - low sodium heart healthy        11/27/23 1347    Discharge instructions       Comments: Thank you for allowing us  to be part of your care. You were hospitalized for cellulitis (skin infection). We treated you with antibiotics and pain medications.  See the changes in your medications and management of your chronic conditions below:  *For your infection -We have STARTED you on these following medications:  -levofloxacin  (antibiotic)--take 1 tablet per day for 7 days.   -You may continue taking ibuprofen  (600mg ) and tylenol  (1000mg ) alternating every 3 hours for pain.   -Keep your skin clean and dry. Make sure to dry well after showering  *For your leg wounds -Follow up with the wound care center.  -Keep your wounds clean and dry   -Please see your PCP in 7 to 10 days  FOLLOW UP APPOINTMENTS: Please call your PCP at: [phone] to schedule an appointment  Please visit your wound care clinic: [facility] [place] on 10/7 at 8:00am   Please make sure to finish all of your antibiotics.  Please call your PCP or our clinic if you have any questions or concerns, we may be able to help and keep you from a long and expensive emergency  room wait. Our clinic and after hours phone number is [phone]. The best time to call is Monday through Friday 9 am to 4 pm but there is always someone available 24/7 if you have an emergency. If you need medication refills please notify your pharmacy one week in advance and they will send us  a request.   We are glad you are feeling better,  Schuyler Novak, DO Internal Medicine Inpatient Teaching Service at [facility]   11/27/23 1347    Discharge wound care:       Comments: Follow up with clinic   11/27/23 1347               Maureen Broad, PA-C 12/02/23 2308    Dreama Longs, MD 12/03/23 [phone]

## 2023-11-26 ENCOUNTER — Other Ambulatory Visit (HOSPITAL_COMMUNITY): Payer: Self-pay

## 2023-11-26 DIAGNOSIS — L03314 Cellulitis of groin: Secondary | ICD-10-CM | POA: Diagnosis not present

## 2023-11-26 DIAGNOSIS — R269 Unspecified abnormalities of gait and mobility: Secondary | ICD-10-CM | POA: Diagnosis present

## 2023-11-26 DIAGNOSIS — Z6841 Body Mass Index (BMI) 40.0 and over, adult: Secondary | ICD-10-CM | POA: Diagnosis not present

## 2023-11-26 DIAGNOSIS — I10 Essential (primary) hypertension: Secondary | ICD-10-CM | POA: Diagnosis present

## 2023-11-26 DIAGNOSIS — Z9981 Dependence on supplemental oxygen: Secondary | ICD-10-CM | POA: Diagnosis not present

## 2023-11-26 DIAGNOSIS — N492 Inflammatory disorders of scrotum: Secondary | ICD-10-CM | POA: Diagnosis present

## 2023-11-26 DIAGNOSIS — Z79890 Hormone replacement therapy (postmenopausal): Secondary | ICD-10-CM | POA: Diagnosis not present

## 2023-11-26 DIAGNOSIS — J9611 Chronic respiratory failure with hypoxia: Secondary | ICD-10-CM | POA: Diagnosis present

## 2023-11-26 DIAGNOSIS — E66813 Obesity, class 3: Secondary | ICD-10-CM | POA: Diagnosis present

## 2023-11-26 DIAGNOSIS — R296 Repeated falls: Secondary | ICD-10-CM | POA: Diagnosis present

## 2023-11-26 DIAGNOSIS — L97929 Non-pressure chronic ulcer of unspecified part of left lower leg with unspecified severity: Secondary | ICD-10-CM | POA: Diagnosis present

## 2023-11-26 DIAGNOSIS — E662 Morbid (severe) obesity with alveolar hypoventilation: Secondary | ICD-10-CM | POA: Diagnosis present

## 2023-11-26 DIAGNOSIS — E661 Drug-induced obesity: Secondary | ICD-10-CM | POA: Diagnosis present

## 2023-11-26 DIAGNOSIS — I890 Lymphedema, not elsewhere classified: Secondary | ICD-10-CM | POA: Diagnosis present

## 2023-11-26 DIAGNOSIS — Z79899 Other long term (current) drug therapy: Secondary | ICD-10-CM | POA: Diagnosis not present

## 2023-11-26 LAB — CBC
HCT: 47.3 % (ref 39.0–52.0)
Hemoglobin: 14.6 g/dL (ref 13.0–17.0)
MCH: 27.1 pg (ref 26.0–34.0)
MCHC: 30.9 g/dL (ref 30.0–36.0)
MCV: 87.9 fL (ref 80.0–100.0)
Platelets: 193 K/uL (ref 150–400)
RBC: 5.38 MIL/uL (ref 4.22–5.81)
RDW: 16.6 % — ABNORMAL HIGH (ref 11.5–15.5)
WBC: 5.6 K/uL (ref 4.0–10.5)
nRBC: 0 % (ref 0.0–0.2)

## 2023-11-26 MED ORDER — VANCOMYCIN HCL 1750 MG/350ML IV SOLN
1750.0000 mg | Freq: Three times a day (TID) | INTRAVENOUS | Status: AC
  Administered 2023-11-26 – 2023-11-27 (×2): 1750 mg via INTRAVENOUS
  Filled 2023-11-26 (×2): qty 350

## 2023-11-26 MED ORDER — DEXTROSE 5 % IV SOLN: 1500.0000 mg | Freq: Once | INTRAVENOUS | Status: DC

## 2023-11-26 MED ORDER — ORITAVANCIN DIPHOSPHATE 400 MG IV SOLR
1200.0000 mg | Freq: Once | INTRAVENOUS | Status: AC
  Administered 2023-11-27: 1200 mg via INTRAVENOUS
  Filled 2023-11-26: qty 120

## 2023-11-26 MED ORDER — IBUPROFEN 600 MG PO TABS
600.0000 mg | ORAL_TABLET | Freq: Three times a day (TID) | ORAL | Status: DC | PRN
  Administered 2023-11-26: 600 mg via ORAL
  Filled 2023-11-26: qty 1

## 2023-11-26 NOTE — Progress Notes (Addendum)
 HD#0 SUBJECTIVE:  Patient Summary: [name] is a 48 y.o. with a pertinent PMH of obesity, hypertension, obstructive sleep apnea who presented with 2-week history of scrotal pain and admitted for scrotal cellulitis.  Overnight Events: No acute events overnight  Interim History: The patient was seen and examined at the bedside this morning he states that he does feel better today and that he has noticed less pain in his scrotum however it is still very tender to palpation.  All questions and concerns were addressed at this time.  OBJECTIVE:  Vital Signs: Vitals:   11/25/23 1940 11/26/23 0006 11/26/23 0411 11/26/23 0800  BP: (!) 154/81 (!) 148/83 (!) 169/97 (!) 166/98  Pulse: 77 75 71 70  Resp: 20 (!) 22 20 18   Temp: 98.2 F (36.8 C) 97.7 F (36.5 C) 97.7 F (36.5 C) 97.7 F (36.5 C)  TempSrc: Oral Oral Oral Oral  SpO2: 95% 92% 94% 96%  Weight:      Height:       Supplemental O2: Room Air SpO2: 96 %  Filed Weights   11/25/23 1242  Weight: (!) 225.9 kg     Intake/Output Summary (Last 24 hours) at 11/26/2023 1105 Last data filed at 11/26/2023 0500 Gross per 24 hour  Intake 1023.89 ml  Output 5450 ml  Net -4426.11 ml   Net IO Since Admission: -4,426.11 mL [11/26/23 1105]  Physical Exam: Const: Awake, alert in NAD HENT: Normocephalic, atraumatic, mucus membranes moist Card: RRR Resp: LCTAB, diminished breath sounds bilaterally  Abd: obese abdomen, soft, nontender Extremities: Warm, pink, lymphedema noted in bilateral lower extremities with open wound on lateral LLE  GU: Edematous scrotum with mild erythema, improved from yesterday. Tender to palpation   Patient Lines/Drains/Airways Status     Active Line/Drains/Airways     Name Placement date Placement time Site Days   Peripheral IV 11/25/23 22 G Anterior;Left Hand 11/25/23  1244  Hand  1   Peripheral IV 11/25/23 20 G Anterior;Right Forearm 11/25/23  1642  Forearm  1   External Urinary Catheter 11/25/23   2214  --  1            Pertinent labs and imaging:     Latest Ref Rng & Units 11/26/2023    4:08 AM 11/25/2023   12:49 PM 06/26/2022    9:18 AM  CBC  WBC 4.0 - 10.5 K/uL 5.6  7.1  7.6   Hemoglobin 13.0 - 17.0 g/dL 85.3  85.0  82.3   Hematocrit 39.0 - 52.0 % 47.3  47.5  55.7   Platelets 150 - 400 K/uL 193  192  236        Latest Ref Rng & Units 11/25/2023   12:49 PM 06/26/2022    9:18 AM 06/25/2022    3:39 PM  CMP  Glucose 70 - 99 mg/dL 894  853  891   BUN 6 - 20 mg/dL 7  14  14    Creatinine 0.61 - 1.24 mg/dL 9.20  9.32  9.09   Sodium 135 - 145 mmol/L 138  134  136   Potassium 3.5 - 5.1 mmol/L 4.3  3.8  4.3   Chloride 98 - 111 mmol/L 98  95  92   CO2 22 - 32 mmol/L 27  30  34   Calcium 8.9 - 10.3 mg/dL 8.4  9.0  9.1   Total Protein 6.5 - 8.1 g/dL 6.6  7.6  7.9   Total Bilirubin 0.0 - 1.2 mg/dL 0.8  0.5  1.1   Alkaline Phos 38 - 126 U/L 58  58  59   AST 15 - 41 U/L 21  42  42   ALT 0 - 44 U/L 20  44  40     US  SCROTUM W/DOPPLER Result Date: 11/25/2023 CLINICAL DATA:  testicular pain ?ischemia EXAM: SCROTAL ULTRASOUND DOPPLER ULTRASOUND OF THE TESTICLES TECHNIQUE: Complete ultrasound examination of the testicles, epididymis, and other scrotal structures was performed. Color and spectral Doppler ultrasound were also utilized to evaluate blood flow to the testicles. COMPARISON:  05/01/2010 FINDINGS: Right testicle Measurements: 6 x 2.9 x 2.8 cm. No mass or microlithiasis visualized. Left testicle Measurements: 5.2 x 2.5 x 2.4 cm. No mass or microlithiasis visualized. Right epididymis:  Normal in size and appearance. Left epididymis:  Normal in size and appearance. Hydrocele:  Small bilateral hydroceles. Varicocele:  None visualized. Pulsed Doppler interrogation of both testes demonstrates normal low resistance arterial and venous waveforms bilaterally. Severe scrotal wall thickening and subcutaneous edema. IMPRESSION: 1. Severe scrotal wall thickening with subcutaneous edema, which  may be due to dependent edema or scrotal cellulitis. 2. Small bilateral hydroceles. 3. No testicular mass, findings of epididymo-orchitis, or changes of testicular torsion, at this time. Electronically Signed   By: Rogelia Myers M.D.   On: 11/25/2023 14:16   DG Chest Portable 1 View Result Date: 11/25/2023 CLINICAL DATA:  fluid overload EXAM: PORTABLE CHEST - 1 VIEW COMPARISON:  06/20/2022 FINDINGS: Diffuse bilateral perihilar interstitial opacities. No focal airspace consolidation, pleural effusion, or pneumothorax. Mild cardiomegaly.No acute fracture or destructive lesion. IMPRESSION: Mild cardiomegaly with findings of interstitial edema. Electronically Signed   By: Rogelia Myers M.D.   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  The patient is morbidly obese with a BMI of 7.46 and has a large pannus covering the scrotum.  Suspect this is contributing to development of scrotal cellulitis due to incontinence of urine and poor hygiene -On exam today the patient's scrotum is visibly less edematous.  It remains tender to palpation however this is improved from yesterday. -Will continue to monitor closely for development of Fournier's gangrene however no signs of open wound, increased erythema, or necrosis on exam today -Will adjust to oritavancin  tomorrow morning and plan to discharge with augmentin.   # Lymphedema Chronic open wounds Patient's wound on left lower extremity looks improved from yesterday. Appreciate wound care's assistance  and recommendation.  # Obstructive sleep apnea # Chronic hypoxic respiratory failure The patient does have a history of OSA and is compliant with his CPAP at night. The patient reports that he has been told in the past that he requires oxygen at all times at home however this has never been reevaluated. While in the room the patient was weaned down and did maintain saturations above 90%. Will obtain ambulatory O2 Continue CPAP at night  #Obesity #Gait disturbances  -BMI 71.46 -Suspect this is contributing to multiple of his problems including his acute scrotal cellulitis, lymphedema, and obstructive sleep apnea. -The patient  is complaining of increased falls stating that his balance is becoming worse -PT OT are consulted, appreciate their assistance  Best Practice: Diet: Regular diet IVF: Fluids: none, Rate: None VTE:  Code: Full  Disposition planning: Therapy Recs: Pending, DME: other pending DISPO: Anticipated discharge pending to Home pending clinical improvement.  Signature:  Schuyler Novak, DO [facility]  11:05 AM, 11/26/2023  On Call pager [phone]

## 2023-11-26 NOTE — Consult Note (Signed)
 WOC Nurse Consult Note: Reason for Consult: Chronic lymphedema, has been seen by cardiology and wound care center in the past with recommendations for compression.  Was noted to be pursuing bariatric surgery in March.  Does not appear that any progress has been made or follow up noted. Does not appear he sees wound care center regularly.  I am attaching resources for lymphedema management in the community for patient to follow up with at home.  Will implement modified compression for now while in hospital.   Has trauma to scrotal area, resulting in cellulitis with noted scrotal edema.  Chronic nonintact lesion to left anterior lower leg.  Patient states this opens and closes.  Wound type: Lymphedema to legs, resulting in nonintact skin, chronic  Trauma to scrotum (injured in a transfer) with subsequent nonintact lesion and edema.  Pressure Injury POA: NA Measurement: LEft lower leg:  1 cm x 1 cm x 0.1 cm  Scrotum, denuded and red, edematous Wound bed: red Drainage (amount, consistency, odor) moderate serous weeping in bilateral lower legs.  Periwound: Edema Lymphedema  Resources  Each site requires a referral from your primary care MD [facility] [address] [place], [place]  [phone] (Upper extremities)  [address] [place], [place] [phone] (Lower extremities, PATIENT CAN NOT HAVE A WOUND)  [facility] [address] [place], [place] [address] [phone]  [facility] [address] Medical Office [address]  [place], [place] [phone]  [facility] [address] [place], [place] [address] [phone]  [facility]  (only treatment for lymphedema related to cancer diagnosis) [address]  [place], [place] [address] [phone]    [facility] [address]  [place], [place] [address] [phone] [facility] [phone] [address] [place], [place] [address] [phone]     Dressing procedure/placement/frequency:Cleanse bilateral lower legs with NS and pat dry.  Apply Aquacel to open wound on left leg (LAWSON # [id number])  Unna bots to bilateral lower legs.  WIll implement orders for ortho tech.  Will not follow at this time.  Please re-consult if needed.  Darice Cooley MSN, RN, FNP-BC CWON Wound, Ostomy, Continence Nurse Outpatient [facility] [phone] Work cell phone:  [phone]

## 2023-11-26 NOTE — Evaluation (Addendum)
 Occupational Therapy Evaluation Patient Details Name: [name] MRN: [id number] DOB: 26-Oct-1975 Today's Date: 11/26/2023   History of Present Illness   [name] is a 48 y.o. admitted 9/17 with scrotal cellulitis. PMH: obesity, hypertension, obstructive sleep apnea     Clinical Impressions Pt presents with decline in function and safety with LB ADLs with impaired activity tolerance. PTA pt lives with his 2 daughters and was Ind with mobility, ADLs, home mgt, drive; reports difficulty with donning socks. Pt currently requires min A with LB ADLs due to scrotal pain and swelling and Mod I - Ind with mobility using no AD. Pt educated on LB ADL A/E with handout and video demo provided. Discussed use of scrotal sling for pt with RN. OT will follow acutely to maximize level of function and safety     If plan is discharge home, recommend the following:   A little help with bathing/dressing/bathroom     Functional Status Assessment   Patient has had a recent decline in their functional status and demonstrates the ability to make significant improvements in function in a reasonable and predictable amount of time.     Equipment Recommendations   Other (comment) (sock aid, LH bath sponge)     Recommendations for Other Services         Precautions/Restrictions   Precautions Precautions: None Restrictions Weight Bearing Restrictions Per Provider Order: No     Mobility Bed Mobility Overal bed mobility: Independent                  Transfers Overall transfer level: Independent                        Balance Overall balance assessment: No apparent balance deficits (not formally assessed)                                         ADL either performed or assessed with clinical judgement   ADL Overall ADL's : Needs assistance/impaired Eating/Feeding: Independent   Grooming: Wash/dry hands;Wash/dry face;Brushing hair;Oral care;Set  up   Upper Body Bathing: Set up;Modified independent;Sitting   Lower Body Bathing: Minimal assistance   Upper Body Dressing : Set up;Modified independent;Sitting   Lower Body Dressing: Minimal assistance   Toilet Transfer: Modified Independent   Toileting- Clothing Manipulation and Hygiene: Modified independent       Functional mobility during ADLs: Modified independent General ADL Comments: pt reports difficulty with socks at baseline, educated pt on sock aid and LH bath sponge for home use with handout provided. Pt currently requiring some assist with LB ADLs due to scrotal pain and swelling     Vision Baseline Vision/History: 1 Wears glasses Ability to See in Adequate Light: 0 Adequate Patient Visual Report: No change from baseline       Perception         Praxis         Pertinent Vitals/Pain Pain Assessment Pain Assessment: Faces Faces Pain Scale: Hurts even more Pain Location: scrotum Pain Descriptors / Indicators: Discomfort, Grimacing, Guarding Pain Intervention(s): Monitored during session, Repositioned, Premedicated before session     Extremity/Trunk Assessment Upper Extremity Assessment Upper Extremity Assessment: Overall WFL for tasks assessed   Lower Extremity Assessment Lower Extremity Assessment: Defer to PT evaluation   Cervical / Trunk Assessment Cervical / Trunk Assessment: Normal   Communication Communication Communication: No  apparent difficulties   Cognition Arousal: Alert Behavior During Therapy: WFL for tasks assessed/performed                                 Following commands: Intact       Cueing  General Comments          Exercises     Shoulder Instructions      Home Living Family/patient expects to be discharged to:: Private residence Living Arrangements: Children (daughters) Available Help at Discharge: Family;Available 24 hours/day Type of Home: Mobile home Home Access: Ramped entrance     Home  Layout: One level     Bathroom Shower/Tub: Producer, television/film/video: Standard     Home Equipment: Other (comment);Adaptive equipment Adaptive Equipment: Reacher Additional Comments: Home O2 at all times per pt, but he states he doesnt wear it all the time, has pulse ox      Prior Functioning/Environment Prior Level of Function : Independent/Modified Independent;Driving             Mobility Comments: No equipment needed to ambulate, 1 fall but tripped over item per pt ADLs Comments: Ind with ADLs, home mgt, drives; reports he has difficulty with donning socks    OT Problem List: Decreased activity tolerance;Obesity;Pain   OT Treatment/Interventions:        OT Goals(Current goals can be found in the care plan section)   Acute Rehab OT Goals Patient Stated Goal: go home OT Goal Formulation: With patient Time For Goal Achievement: 12/10/23 Potential to Achieve Goals: Good ADL Goals Pt Will Perform Lower Body Bathing: with contact guard assist;with supervision;with adaptive equipment Pt Will Perform Lower Body Dressing: with contact guard assist;with supervision;with adaptive equipment   OT Frequency:       Co-evaluation              AM-PAC OT 6 Clicks Daily Activity     Outcome Measure Help from another person eating meals?: None Help from another person taking care of personal grooming?: None Help from another person toileting, which includes using toliet, bedpan, or urinal?: None Help from another person bathing (including washing, rinsing, drying)?: A Little Help from another person to put on and taking off regular upper body clothing?: None Help from another person to put on and taking off regular lower body clothing?: A Little 6 Click Score: 22   End of Session Equipment Utilized During Treatment: Other (comment) (wide sock aid) Nurse Communication: Mobility status;Other (comment) (pt would benefit from scrotal sling and spoke with RN who was  requesting one)  Activity Tolerance: Patient tolerated treatment well Patient left: in bed  OT Visit Diagnosis: Muscle weakness (generalized) (M62.81)                Time: 8642-8564 OT Time Calculation (min): 38 min Charges:  OT General Charges $OT Visit: 1 Visit OT Evaluation $OT Eval Low Complexity: 1 Low OT Treatments $Self Care/Home Management : 8-22 mins $Therapeutic Activity: 8-22 mins    Jacques Karna Loose 11/26/2023, 2:49 PM

## 2023-11-26 NOTE — TOC CM/SW Note (Signed)
 Transition of Care [facility]) - Inpatient Brief Assessment   Patient Details  Name: [name] MRN: [id number] Date of Birth: [birth year]  Transition of Care [facility]) CM/SW Contact:    Tom-Johnson, Harvest Muskrat, RN Phone Number: 11/26/2023, 3:54 PM   Clinical Narrative:  Patient presented to the ED with worsening Scrotal Pain/Swelling. Patient also noted with Bilateral Lymph Edema and LLE Cellulitis. Currently on IV abx.  Patient has hx of OSA, uses BIPAP at bedtime, Obesity, HTN. WOC following for Cellulitis.   CM spoke with patient at bedside about needs for post hospital transition. From home with ex-wife and two children. Has two supportive sisters. Independent with care, has a walk-in shower and shower seat at home. Has home O2 from [employer]. Currently unemployed, states he is working on getting his disability. PCP is Samie Frederick, PA-C and uses [facility] in [place].  No PT/OT f/u, no ICM needs or recommendations noted at this time.  Patient not Medically ready for discharge.  CM will continue to follow as patient progresses with care towards discharge.            Transition of Care Asessment: Insurance and Status: Insurance coverage has been reviewed Patient has primary care physician: Yes Home environment has been reviewed: Yes Prior level of function:: Independent Prior/Current Home Services: No current home services Social Drivers of Health Review: SDOH reviewed no interventions necessary Readmission risk has been reviewed: Yes Transition of care needs: no transition of care needs at this time

## 2023-11-26 NOTE — Evaluation (Signed)
 Physical Therapy Evaluation and D/C Patient Details Name: [name] MRN: [id number] DOB: 1976-02-18 Today's Date: 11/26/2023  History of Present Illness  [name] is a 48 y.o. admitted 9/17 with scrotal cellulitis. PMH: obesity, hypertension, obstructive sleep apnea  Clinical Impression  Pt admitted with above diagnosis.  Pt currently without functional limitations and is independent and at baseline other than pain in scrotum due to swelling.  Mobility specialists can follow while pt in hospital to ensure mobility. Will sign off.     If plan is discharge home, recommend the following: Help with stairs or ramp for entrance   Can travel by private vehicle        Equipment Recommendations None recommended by PT  Recommendations for Other Services       Functional Status Assessment Patient has not had a recent decline in their functional status     Precautions / Restrictions Precautions Precautions: None Restrictions Weight Bearing Restrictions Per Provider Order: No      Mobility  Bed Mobility Overal bed mobility: Independent                  Transfers Overall transfer level: Independent                      Ambulation/Gait Ambulation/Gait assistance: Independent Gait Distance (Feet): 50 Feet Assistive device: None Gait Pattern/deviations: Step-through pattern, Decreased stride length, Wide base of support (Wide BOS due to body habitus)   Gait velocity interpretation: <1.31 ft/sec, indicative of household ambulator   General Gait Details: Pt without LOB without equipment. Pt states his balance is fine and he is mostly back to normal gait pattern and just has scrotal pain with movement.  Stairs            Wheelchair Mobility     Tilt Bed    Modified Rankin (Stroke Patients Only)       Balance                                             Pertinent Vitals/Pain Pain Assessment Pain Assessment: Faces Faces Pain  Scale: Hurts even more Pain Location: scrotum Pain Descriptors / Indicators: Discomfort, Grimacing, Guarding Pain Intervention(s): Limited activity within patient's tolerance, Monitored during session, Repositioned    Home Living Family/patient expects to be discharged to:: Private residence Living Arrangements: Children (daughters) Available Help at Discharge: Family;Available 24 hours/day (oldest daigtjer home schools) Type of Home: Mobile home Home Access: Ramped entrance       Home Layout: One level Home Equipment:  (pulse oximeter, O2) Additional Comments: Home O2 at all times per pt, but he states he doesnt wear it all the time    Prior Function               Mobility Comments: No equipment needed to ambulate, 1 fall but tripped over item per pt ADLs Comments: B/D self, needs assist with socks     Extremity/Trunk Assessment   Upper Extremity Assessment Upper Extremity Assessment: Defer to OT evaluation    Lower Extremity Assessment Lower Extremity Assessment: Overall WFL for tasks assessed    Cervical / Trunk Assessment Cervical / Trunk Assessment: Normal  Communication   Communication Communication: No apparent difficulties    Cognition Arousal: Alert Behavior During Therapy: WFL for tasks assessed/performed   PT - Cognitive impairments: No apparent impairments  Following commands: Intact       Cueing       General Comments      Exercises     Assessment/Plan    PT Assessment Patient does not need any further PT services  PT Problem List         PT Treatment Interventions      PT Goals (Current goals can be found in the Care Plan section)  Acute Rehab PT Goals Patient Stated Goal: to get cellultis healed PT Goal Formulation: All assessment and education complete, DC therapy    Frequency       Co-evaluation               AM-PAC PT 6 Clicks Mobility  Outcome Measure Help needed turning  from your back to your side while in a flat bed without using bedrails?: None Help needed moving from lying on your back to sitting on the side of a flat bed without using bedrails?: None Help needed moving to and from a bed to a chair (including a wheelchair)?: None Help needed standing up from a chair using your arms (e.g., wheelchair or bedside chair)?: None Help needed to walk in hospital room?: None Help needed climbing 3-5 steps with a railing? : Total 6 Click Score: 21    End of Session   Activity Tolerance: Patient tolerated treatment well Patient left: in bed;with call bell/phone within reach;with bed alarm set Nurse Communication: Mobility status PT Visit Diagnosis: Muscle weakness (generalized) (M62.81)    Time: 8972-8955 PT Time Calculation (min) (ACUTE ONLY): 17 min   Charges:   PT Evaluation $PT Eval Low Complexity: 1 Low   PT General Charges $$ ACUTE PT VISIT: 1 Visit         Yailin Biederman M,PT Acute Rehab Services [phone]   Stephane JULIANNA Bevel 11/26/2023, 2:11 PM

## 2023-11-26 NOTE — Plan of Care (Signed)
   Problem: Safety: Goal: Ability to remain free from injury will improve Outcome: Progressing

## 2023-11-27 ENCOUNTER — Other Ambulatory Visit (HOSPITAL_COMMUNITY): Payer: Self-pay

## 2023-11-27 DIAGNOSIS — N492 Inflammatory disorders of scrotum: Secondary | ICD-10-CM | POA: Diagnosis not present

## 2023-11-27 DIAGNOSIS — Z6841 Body Mass Index (BMI) 40.0 and over, adult: Secondary | ICD-10-CM | POA: Diagnosis not present

## 2023-11-27 DIAGNOSIS — L03314 Cellulitis of groin: Secondary | ICD-10-CM | POA: Diagnosis not present

## 2023-11-27 LAB — CBC
HCT: 47.3 % (ref 39.0–52.0)
Hemoglobin: 14.8 g/dL (ref 13.0–17.0)
MCH: 27.2 pg (ref 26.0–34.0)
MCHC: 31.3 g/dL (ref 30.0–36.0)
MCV: 86.8 fL (ref 80.0–100.0)
Platelets: 188 K/uL (ref 150–400)
RBC: 5.45 MIL/uL (ref 4.22–5.81)
RDW: 16.4 % — ABNORMAL HIGH (ref 11.5–15.5)
WBC: 5.4 K/uL (ref 4.0–10.5)
nRBC: 0 % (ref 0.0–0.2)

## 2023-11-27 MED ORDER — LEVOFLOXACIN 500 MG PO TABS
500.0000 mg | ORAL_TABLET | Freq: Every day | ORAL | 0 refills | Status: AC
  Filled 2023-11-27: qty 7, 7d supply, fill #0

## 2023-11-27 MED ORDER — LEVOFLOXACIN 250 MG PO TABS
250.0000 mg | ORAL_TABLET | Freq: Every day | ORAL | 0 refills | Status: DC
  Filled 2023-11-27: qty 7, 7d supply, fill #0

## 2023-11-27 MED ORDER — ACETAMINOPHEN 500 MG PO TABS
1000.0000 mg | ORAL_TABLET | Freq: Four times a day (QID) | ORAL | 0 refills | Status: AC
  Filled 2023-11-27: qty 56, 7d supply, fill #0

## 2023-11-27 MED ORDER — IBUPROFEN 600 MG PO TABS
600.0000 mg | ORAL_TABLET | Freq: Three times a day (TID) | ORAL | 0 refills | Status: AC | PRN
  Filled 2023-11-27: qty 30, 10d supply, fill #0

## 2023-11-27 NOTE — Progress Notes (Signed)
 Orthopedic Tech Progress Note Patient Details:  [name] 1975-10-28 [id number]  Patient ID: [name], male   DOB: 09-05-1975, 48 y.o.   MRN: [id number] RN called about U.B but patient needs wound care first. She will perform wound care right at shift change for AM ortho to perform later on.   [name] 11/27/2023, 6:56 AM

## 2023-11-27 NOTE — Discharge Summary (Signed)
 Name: [name] MRN: [id number] DOB: 1975-12-25 48 y.o. PCP: Samie Frederick, PA-C  Date of Admission: 11/25/2023 12:28 PM Date of Discharge: 11/27/2023 Attending Physician: Dr. Reyes Fenton  Discharge Diagnosis: 1. 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   amLODipine  10 MG tablet Commonly known as: NORVASC  Take 1 tablet (10 mg total) by mouth daily.   furosemide  20 MG tablet Commonly known as: LASIX  Take 20 mg by mouth daily.   ibuprofen  600 MG tablet Commonly known as: ADVIL  Take 1 tablet (600 mg total) by mouth every 8 (eight) hours as needed for moderate pain (pain score 4-6).   levofloxacin  500 MG tablet Commonly known as: LEVAQUIN  Take 1 tablet (500 mg total) by mouth daily for 7 days.   levothyroxine  50 MCG tablet Commonly known as: SYNTHROID  Take 50 mcg by mouth daily before breakfast.   lisinopril -hydrochlorothiazide  20-25 MG tablet Commonly known as: ZESTORETIC  Take 1 tablet by mouth daily.   ProAir  HFA 108 (90 Base) MCG/ACT inhaler Generic drug: albuterol  Inhale 2 puffs into the lungs every 4 (four) hours as needed for wheezing.   topiramate  50 MG tablet Commonly known as: TOPAMAX  Take 50 mg by mouth daily.        Disposition and follow-up:   Mr.[name] was [facility] from [facility] in Stable condition.  At the hospital follow up visit please address:  Scrotal cellulitis--patient was given a dose of oritavancin  and discharged with 7 days of ciprofloxacin for treatment of scrotal cellulitis.  This patient is at high risk for foreigners gangrene so unsure no open wounds, necrosis, increased erythema, increased swelling are occurring at the scrotum or surrounding areas. Lymphedema--patient has a significant history of lymphedema with a chronic open wound on the left lower extremity.  He was scheduled for follow-up with the wound care clinic on 10/7.  Ensure that he is able to attend and get treatment as necessary. Chronic hypoxic respiratory failure--patient reported that he was on 2 L nasal cannula at home which he did not wear consistently but would when he is ambulating.  Ambulatory oxygen saturation test here shows that the patient would desat to 70s while ambulating on room air.  He was instructed to continue 2 L.   Hospital Course by problem list: [name] is a 48 y.o. person living with a history of obesity, lymphedema, OSA, obesity hypoventilation syndrome, chronic hypoxic respiratory failure who presented with a 2-week history of pain and swelling of his scrotum and admitted for cellulitis now being discharged on hospital day 1 with the following pertinent hospital course:  Scrotal cellulitis Lymphedema The patient was admitted on 9/17 after presenting to the ER for 2-week history of swelling and  pain noted in his scrotum.  He reports that he hit his scrotum on a seat getting into his car and since then has had significant pain however it had started swelling was becoming quite painful and difficult to sit.  Upon evaluation it was determined that he had cellulitis of the scrotum.  He was started on Vanco, cefepime , and Flagyl  due to high risk of development of Fournier's gangrene.  He is morbidly obese with a BMI over 70 and suspect this was  contributing to the development of cellulitis due to difficulty with hygiene and urinary incontinence.  He was evaluated the next day with improving symptoms and reduced erythema and edema of the scrotum.  On 9/19 he was evaluated and stated he was feeling significantly better however it is still tender to the touch but much more manageable than when he was admitted.  At this time he was given 1 dose of oritavancin  and started on 7-day oral course of ciprofloxacin.  Also instructed to keep the area clean and dry as best as he could.  He was deemed stable for discharge at this time with close follow-up with both wound care clinic and his PCP.  Lymphedema Chronic open wounds The patient does have a significant history of lymphedema and has followed with wound care in the past however he has not recently.  On admission he reported that he had a wound on the left lower extremity that would open and close spontaneously by itself.  This did look like an abrasion rather than a venous stasis ulcer.  Wound care was consulted who recommended compression and close follow-up with an outpatient wound care clinic.  He was set up for an appointment on 10/7 at 8 AM with the [place] wound center instructed follow-up with them and his PCP.  Stable chronic medical conditions: Chronic hypoxic respiratory failure Orbit obesity   Subjective The patient was seen and examined at the bedside this morning.  He said he was feeling much better but that his scrotum was still tender to the touch and uncomfortable.  He was comfortable at this time going home with oral antibiotics and close PCP follow-up.  Discharge Exam:   BP (!) 156/86 (BP Location: Left Wrist)   Pulse 87   Temp 98.3 F (36.8 C) (Oral)   Resp 18   Ht 5' 10 (1.778 m)   Wt (!) 225.9 kg   SpO2 90%   BMI 71.46 kg/m  Discharge exam:  Const: Awake, alert in NAD HENT: Normocephalic, atraumatic, mucus membranes moist Card: RRR, No MRG Resp: LCTAB,  sounds diminished bilaterally Abd: Obese abdomen soft nontender nondistended Extremities: Warm, pink, significantly lymphedema noted bilaterally in the lower extremities below the knees.  There is an open wound on the left lateral shin. GU: Edematous scrotum with improving erythema and tenderness to palpation.   Pertinent Labs, Studies, and Procedures:     Latest Ref Rng & Units 11/27/2023    3:42 AM 11/26/2023    4:08 AM 11/25/2023   12:49 PM  CBC  WBC 4.0 - 10.5 K/uL 5.4  5.6  7.1   Hemoglobin 13.0 - 17.0 g/dL 85.1  85.3  85.0   Hematocrit 39.0 - 52.0 % 47.3  47.3  47.5   Platelets 150 - 400 K/uL 188  193  192        Latest Ref Rng & Units 11/25/2023   12:49 PM 06/26/2022    9:18 AM 06/25/2022    3:39 PM  CMP  Glucose 70 - 99 mg/dL 894  853  891   BUN 6 - 20 mg/dL 7  14  14    Creatinine 0.61 - 1.24 mg/dL 9.20  9.32  9.09   Sodium 135 - 145 mmol/L 138  134  136   Potassium 3.5 - 5.1 mmol/L 4.3  3.8  4.3   Chloride 98 - 111 mmol/L 98  95  92   CO2 22 - 32 mmol/L 27  30  34   Calcium 8.9 - 10.3 mg/dL 8.4  9.0  9.1   Total Protein 6.5 - 8.1 g/dL 6.6  7.6  7.9   Total Bilirubin 0.0 - 1.2 mg/dL 0.8  0.5  1.1   Alkaline Phos 38 - 126 U/L 58  58  59   AST 15 - 41 U/L 21  42  42   ALT 0 - 44 U/L 20  44  40     US  SCROTUM W/DOPPLER Result Date: 11/25/2023 CLINICAL DATA:  testicular pain ?ischemia EXAM: SCROTAL ULTRASOUND DOPPLER ULTRASOUND OF THE TESTICLES TECHNIQUE: Complete ultrasound examination of the testicles, epididymis, and other scrotal structures was performed. Color and spectral Doppler ultrasound were also utilized to evaluate blood flow to the testicles. COMPARISON:  05/01/2010 FINDINGS: Right testicle Measurements: 6 x 2.9 x 2.8 cm. No mass or microlithiasis visualized. Left testicle Measurements: 5.2 x 2.5 x 2.4 cm. No mass or microlithiasis visualized. Right epididymis:  Normal in size and appearance. Left epididymis:  Normal in size and appearance. Hydrocele:  Small  bilateral hydroceles. Varicocele:  None visualized. Pulsed Doppler interrogation of both testes demonstrates normal low resistance arterial and venous waveforms bilaterally. Severe scrotal wall thickening and subcutaneous edema. IMPRESSION: 1. Severe scrotal wall thickening with subcutaneous edema, which may be due to dependent edema or scrotal cellulitis. 2. Small bilateral hydroceles. 3. No testicular mass, findings of epididymo-orchitis, or changes of testicular torsion, at this time. Electronically Signed   By: Rogelia Myers M.D.   On: 11/25/2023 14:16   DG Chest Portable 1 View Result Date: 11/25/2023 CLINICAL DATA:  fluid overload EXAM: PORTABLE CHEST - 1 VIEW COMPARISON:  06/20/2022 FINDINGS: Diffuse bilateral perihilar interstitial opacities. No focal airspace consolidation, pleural effusion, or pneumothorax. Mild cardiomegaly.No acute fracture or destructive lesion. IMPRESSION: Mild cardiomegaly with findings of interstitial edema. Electronically Signed   By: Rogelia Myers M.D.   On: 11/25/2023 13:28     Discharge Instructions: Discharge Instructions     AMB referral to wound care center   Complete by: As directed    [facility] [address] [place], [place]  [phone] (Upper extremities)   [address] [place], [place] [phone] (Lower extremities, PATIENT CAN NOT HAVE A WOUND)   [facility] [address] [place], [place] [address] [phone]   [facility] [address], [address] Medical Office [address]  [place], [place] [phone]  [facility] [address] [place], [place] [address] [phone]   [facility]  (only treatment for lymphedema related to cancer diagnosis) [address]  [place], [place] [address] [phone]     [facility] [address]  [place], [place] [address] [phone] [facility] ([facility]) [phone] [address] [place], [place] [address] [phone]       Signed: Myrna Bitters, DO 11/27/2023, 1:32 PM

## 2023-11-27 NOTE — Progress Notes (Signed)
 Mobility Specialist Progress Note:    11/27/23 1100  Mobility  Activity Ambulated with assistance (In room)  Level of Assistance Independent  Assistive Device None  Distance Ambulated (ft) 50 ft  Activity Response Tolerated well  Mobility Referral Yes  Mobility visit 1 Mobility  Mobility Specialist Start Time (ACUTE ONLY) 1005  Mobility Specialist Stop Time (ACUTE ONLY) 1019  Mobility Specialist Time Calculation (min) (ACUTE ONLY) 14 min   Received pt in bed and agreeable to mobility. Found pt on 1 L/min O2. Pt ambulated around the room without need for physical assistance. Pt c/o BLE and back pain, otherwise tolerated well. Pt ambulated on RA. SPO2 decreased to >70%. Once returned to bed, O2 increased to 2 L/min. SPO2 increased to >93%. Left pt in bed with personal belongings and call light within reach. All needs met. RN notified.  Lavanda Pollack Mobility Specialist  Please contact via [employer] or  Rehab Office [phone]

## 2023-11-27 NOTE — Discharge Instructions (Addendum)
 Thank you for allowing us  to be part of your care. You were hospitalized for cellulitis (skin infection). We treated you with antibiotics and pain medications.  See the changes in your medications and management of your chronic conditions below:  *For your infection -We have STARTED you on these following medications:  -levofloxacin  (antibiotic)--take 1 tablet per day for 7 days.   -You may continue taking ibuprofen  (600mg ) and tylenol  (1000mg ) alternating every 3 hours for pain.   -Keep your skin clean and dry. Make sure to dry well after showering  *For your leg wounds -Follow up with the wound care center.  -Keep your wounds clean and dry   -Please see your PCP in 7 to 10 days  FOLLOW UP APPOINTMENTS: Please call your PCP at: [phone] to schedule an appointment  Please visit your wound care clinic  [facility] on 10/7 at 8:00am    Please make sure to finish all of your antibiotics.  Please call your PCP or our clinic if you have any questions or concerns, we may be able to help and keep you from a long and expensive emergency room wait. Our clinic and after hours phone number is [phone]. The best time to call is Monday through Friday 9 am to 4 pm but there is always someone available 24/7 if you have an emergency. If you need medication refills please notify your pharmacy one week in advance and they will send us  a request.   We are glad you are feeling better,  Schuyler Novak, DO Internal Medicine Inpatient Teaching Service at [facility]

## 2023-11-27 NOTE — Progress Notes (Signed)
 Nurse requested Mobility Specialist to perform oxygen saturation test with pt which includes removing pt from oxygen both at rest and while ambulating.  Below are the results from that testing.     Patient Saturations on Room Air at Rest = spO2 90-93%  Patient Saturations on Room Air while Ambulating = sp02 70% .  Patient Saturations on 2 Liters of oxygen while Ambulating = sp02 89%  At end of testing pt left in room on 2 Liters of oxygen.  Reported results to nurse.

## 2023-11-30 ENCOUNTER — Other Ambulatory Visit (HOSPITAL_COMMUNITY): Payer: Self-pay

## 2023-12-15 ENCOUNTER — Encounter (HOSPITAL_BASED_OUTPATIENT_CLINIC_OR_DEPARTMENT_OTHER): Admitting: Internal Medicine

## 2023-12-29 ENCOUNTER — Ambulatory Visit (HOSPITAL_BASED_OUTPATIENT_CLINIC_OR_DEPARTMENT_OTHER): Admitting: Internal Medicine

## 2024-01-18 ENCOUNTER — Encounter (HOSPITAL_BASED_OUTPATIENT_CLINIC_OR_DEPARTMENT_OTHER): Admitting: Internal Medicine

## 2024-04-01 NOTE — ED Provider Notes (Addendum)
 " [facility]  ED Provider Note  [name]. 49 y.o. male DOB: Apr 30, 1975 MRN: [id number] History   Chief Complaint  Patient presents with   Cellulitis    BIBM d/t worsening cellulitis, redness present on lower abdomen and groin. A&Ox4. Wears 3L    Patient presents to the emergency room complaining of panniculitis which started yesterday has increased in severity  Patient states his infections usually started in his legs Patient has a history of morbid obesity presently weighs 240 kg  History of hyperlipidemia hypertension hypothyroidism GERD sleep apnea  Medicines see list  Allergies to penicillin and Dilaudid  Smoke none alcohol social Operations dental extraction        Past Medical History:  Diagnosis Date   Allergy    Arthritis    Back pain    Bronchitis    Fatty liver    Foot pain, bilateral    GERD (gastroesophageal reflux disease)    Hyperlipidemia 07/31/2016   Hypertension    Hypothyroidism 04/12/2014   Morbid obesity (*)    Sleep apnea    BIPAP    Past Surgical History:  Procedure Laterality Date   Wisdom tooth extraction      Social History   Substance and Sexual Activity  Alcohol Use Not Currently   Alcohol/week: 3.0 standard drinks of alcohol   Types: 3 Cans of beer per week   Comment: occasionally   Tobacco Use History[1] E-Cigarettes   Vaping Use Never User    Start Date     Cartridges/Day     Quit Date     Social History   Substance and Sexual Activity  Drug Use No         Allergies[2]  Home Medications   AMLODIPINE  BESYLATE (NORVASC ) 10 MG TABLET    Take one tablet (10 mg dose) by mouth daily.   CETIRIZINE (ZYRTEC) 10 MG TABLET    Take one tablet (10 mg dose) by mouth daily.   ERGOCALCIFEROL 50,000 UNITS CAPS CAPSULE    Take 1 capsule twice weekly.   FAMOTIDINE (PEPCID) 20 MG TABLET    Take one tablet (20 mg dose) by mouth 2 (two) times daily.   FUROSEMIDE  (LASIX ) 20  MG TABLET    TAKE 2 TABLETS BY MOUTH ONCE DAILY 30 MINUTES  BEFORE BREAKFAST   IBUPROFEN  (ADVIL ,MOTRIN ) 200 MG TABLET    Take three tablets (600 mg dose) by mouth every 6 (six) hours as needed for Fever.   KETOROLAC  (TORADOL ) 10 MG TABLET    Take one tablet (10 mg dose) by mouth every 6 (six) hours as needed for Pain for up to 15 days.   LEVOTHYROXINE  SODIUM (SYNTHROID ,LEVOTHROID,LOVOXYL) 50 MCG TABLET    TAKE 1 TABLET BY MOUTH ONCE DAILY ON AN EMPTY STOMACH .   LISINOPRIL  (PRINIVIL ,ZESTRIL ) 20 MG TABLET    Take one tablet (20 mg dose) by mouth daily.   METFORMIN (GLUCOPHAGE) 500 MG TABLET    Take one tablet (500 mg dose) by mouth 2 (two) times daily with meals. Take one tablet (500mg ) daily and increase to 2x daily and then to 2 tablets (1000mg ) 2x daily as GI side effects allow   METOPROLOL SUCCINATE (TOPROL-XL) 50 MG 24 HR TABLET    Take one tablet (50 mg dose) by mouth daily.   NA SULFATE-POTASSIUM SULFATE-MAGNESIUM SULFATE (SUPREP BOWEL PREP) 17.5-3.13-1.6 GM/177ML    Take 177 mLs by mouth 2 (two) times.   PROAIR  HFA 108 (90 BASE) MCG/ACT INHALER  INHALE 2 PUFFS INTO THE LUNGS EVERY 4 HOURS AS NEEDED FOR WHEEZING   SEMAGLUTIDE,0.25 OR 0.5MG /DOS, (OZEMPIC, 0.25 OR 0.5 MG/DOSE,) 2 MG/3ML SOPN    Inject 0.25 mg into the skin once a week for 28 days, THEN 0.5 mg once a week for 28 days.   TOPIRAMATE  (TOPAMAX ) 50 MG TABLET    Take one tablet (50 mg dose) by mouth 2 (two) times daily.    Primary Survey  Primary Survey  Review of Systems   Review of Systems  Constitutional:  Negative for fever.  Respiratory:  Negative for shortness of breath.   Cardiovascular:  Negative for chest pain.  Gastrointestinal:  Negative for abdominal pain.  Skin:  Positive for color change.  All other systems reviewed and are negative.   Physical Exam   ED Triage Vitals [04/01/24 2002]  BP (!) 146/103  Heart Rate 86  Resp 18  SpO2 96 %  Temp 98.3 F (36.8 C)    Physical Exam  Nursing note and vitals  reviewed. Constitutional: He appears well-developed. He does not appear distressed.  HENT:  Head: Normocephalic and atraumatic.  Eyes: Pupils are equal, round, and reactive to light.  Neck: Normal range of motion. Neck supple.  Cardiovascular: Normal rate and regular rhythm.  Pulmonary/Chest: Respiratory effort normal.  Abdominal: Soft.  Musculoskeletal: Normal range of motion.     Cervical back: Normal range of motion and neck supple.   Neurological: He is alert.  Skin: Skin is warm.  Psychiatric: He has a normal mood and affect.     ED Course   Lab results:   CBC AND DIFFERENTIAL - Abnormal      Result Value   WBC 6.3     RBC 6.19 (*)    HGB 16.4     HCT 52.7 (*)    MCV 85.1     MCH 26.5     MCHC 31.1 (*)    Plt Ct 219     RDW SD 50.2 (*)    MPV 10.0     NRBC% 0.0     Absolute NRBC Count 0.00     NEUTROPHIL % 65.8     LYMPHOCYTE % 20.0     MONOCYTE % 12.1     Eosinophil % 1.3     BASOPHIL % 0.5     IG% 0.3     ABSOLUTE NEUTROPHIL COUNT 4.17     ABSOLUTE LYMPHOCYTE COUNT 1.27     Absolute Monocyte Count 0.77     Absolute Eosinophil Count 0.08     Absolute Basophil Count 0.03     Absolute Immature Granulocyte Count 0.02    COMPREHENSIVE METABOLIC PANEL - Abnormal   Na 136     Potassium 4.4     Cl 98     CO2 28     AGAP 10     Glucose 124 (*)    BUN 10     Creatinine 0.73 (*)    Ca 9.3     ALK PHOS 76     T Bili 0.5     Total Protein 7.4     Alb 3.6     GLOBULIN 3.8     ALBUMIN/GLOBULIN RATIO 0.9 (*)    BUN/CREAT RATIO 14     ALT 9     AST 18     eGFR 112     Comment: Normal GFR (glomerular filtration rate) > 60 mL/min/1.73 meters squared, < 60 may include impaired kidney function.  Calculation based on the Chronic Kidney Disease Epidemiology Collaboration (CK-EPI)equation refit without adjustment for race.  COVID-19, FLU A+B AND RSV - Normal   Flu A Negative     Flu B Negative     RSV PCR Negative     SARS-COV-2 Not Detected     Narrative:     SARS-COV-2 (COVID-19)PCR-Negative results do not preclude SARS-CoV-2 infection and should not be used as the sole basis for patient management decisions. Negative results must be combined with clinical observations, patient history, and epidemiological information.  Flu and/or RSV - Negative results do not preclude the presence of Flu or RSV virus and should not be used as the sole basis for treatment or other patient management decisions. False negative results may occur if virus is present at levels below the analytical limit of detection.  This test detects Influenza A, Influenza B, and Respiratory Syncytial Virus and SARS-COV-2 (COVID-19) by PCR.     MAGNESIUM - Normal   Mg 2.0    LACTIC ACID - Normal   Lactic Acid 1.0    CULTURE, BLOOD  CULTURE, BLOOD  URINALYSIS ONLY - NO SYMPTOMS    Imaging:   XR CHEST AP PORTABLE   Narrative:    INDICATION: Shortness of breath  TECHNIQUE: XR CHEST AP PORTABLE.   Exam date/time: 04/01/2024 8:03 PM.  Comparison 05/15/2023.  FINDINGS: Cardiomegaly with pulmonary venous congestion.    Impression:    IMPRESSION: 1. Cardiomegaly with pulmonary venous congestion  Electronically Signed by: Glendia Guillaume, MD on 04/01/2024 9:39 PM     ECG: ECG Results   None                                                    Time Onset of Severe Sepsis/Septic Shock:                   Pre-Sedation Procedures    Medical Decision Making Patient presents with panniculitis/patient is morbidly obese/noticed erythema to his abdominal wall yesterday increasing today  Laboratory studies were unremarkable/ However because of the extent of the cellulitis to his abdominal wall and his morbid obesity recommend parenteral antibiotics  I was able to update the patient at bedside regarding completed test (i.e. laboratory studies, ECG, imaging).  We discussed plan of care, recommendations, and questions were answered   Amount  and/or Complexity of Data Reviewed Labs: ordered. Radiology: ordered. ECG/medicine tests: ordered.  Risk Prescription drug management. Decision regarding hospitalization.          Provider Communication  New Prescriptions   No medications on file    Modified Medications   No medications on file    Discontinued Medications   No medications on file    Clinical Impression Final diagnoses:  Abdominal wall cellulitis  Morbid obesity (*)    ED Disposition     ED Disposition  Admit   Condition  --   Comment  --                   Electronically signed by:    Lynwood JAYSON Life, MD 04/01/24 2141       [1] Social History Tobacco Use  Smoking Status Never   Passive exposure: Never  Smokeless Tobacco Never  [2] Allergies Allergen Reactions   Hydromorphone Itching and Rash   Penicillins Rash  CHILDHOOD ALLY   Lynwood JAYSON Life, MD 04/01/24 2141  "

## 2024-04-04 NOTE — Progress Notes (Signed)
 Pt placed on BiPAP HS.

## 2024-04-04 NOTE — Progress Notes (Signed)
 Pt stated he will go on BiPAP around 2200. RT will check back on pt.

## 2024-04-05 NOTE — Progress Notes (Signed)
 Pt sitting up in chair, on 4L [place]. No distress noted. Pt states no needs at this time.

## 2024-04-05 NOTE — Progress Notes (Signed)
 BiPAP removed per pt request. Pt placed on 4L [place], RN aware.

## 2024-04-05 NOTE — Progress Notes (Signed)
 [facility] Case Management Discharge Note   Patient:   [name]. MR Number:  [id number] Patient Date of Birth: 1975/09/20 Age/Sex:  49 y.o./male   Discharge Plan   Case Management interviewed: Patient Disposition: Home                                           Discussed readiness, willingness and ability to provide or support self-management activities when needed after discharge from the acute care setting with: Patient       Transportation   Does the patient need discharge transport arranged?: No   Mode of transportation?: private vehicle    Accepted Agency   Selected Continued Care - Admitted Since 04/01/2024   No services have been selected for the patient.     1100 Patient dicussed during care conference.  Per provider patient will d/c home on po antibiotics today.   1325 CM received chat from nurse Rosina stating  patient does not have any home 02.  CM requested 02 sats.  CM received chat from [place] 02 sats at rest on RA at 88%.   1425 CM met with patient at bedside, he is sitting up in chair, alert and oriented, able to state name and DOB.  Patient states he is on home 02 at 3 liters provided by [facility], he does not wear it all the time, has CPAP, Dr. Derrick and nurse, Rosina notified.  CM informed he will need to have someone to bring home portable 02.  Patient states his tanks were emptied.  CM provided patient with portable 02 from [employer] closet and informed she ill call [employer] to deliver portable 02 to the home, Jermaine called at 1438.  Patient will dc home.   Electronically signed: Marzette Manifold, RN BSN 04/05/2024 2:41 PM

## 2024-04-05 NOTE — Progress Notes (Signed)
 Pt placed back on BiPAP. Pt tolerating well at this time.
